# Patient Record
Sex: Female | Born: 1955 | Race: White | Hispanic: No | Marital: Married | Smoking: Never smoker
Health system: Southern US, Community
[De-identification: ages and names within clinical notes are randomized; demographics above are authoritative.]

## PROBLEM LIST (undated history)

## (undated) DIAGNOSIS — K5792 Diverticulitis of intestine, part unspecified, without perforation or abscess without bleeding: Secondary | ICD-10-CM

## (undated) DIAGNOSIS — I219 Acute myocardial infarction, unspecified: Secondary | ICD-10-CM

## (undated) HISTORY — PX: KNEE ARTHROPLASTY: SHX992

## (undated) HISTORY — PX: ABDOMINAL HYSTERECTOMY: SHX81

## (undated) HISTORY — PX: APPENDECTOMY: SHX54

## (undated) HISTORY — PX: CHOLECYSTECTOMY: SHX55

---

## 1998-05-29 ENCOUNTER — Encounter: Payer: Self-pay | Admitting: Emergency Medicine

## 1998-05-29 ENCOUNTER — Emergency Department (HOSPITAL_COMMUNITY): Admission: EM | Admit: 1998-05-29 | Discharge: 1998-05-29 | Payer: Self-pay | Admitting: Emergency Medicine

## 2001-08-01 ENCOUNTER — Encounter: Payer: Self-pay | Admitting: Emergency Medicine

## 2001-08-01 ENCOUNTER — Emergency Department (HOSPITAL_COMMUNITY): Admission: EM | Admit: 2001-08-01 | Discharge: 2001-08-01 | Payer: Self-pay | Admitting: Emergency Medicine

## 2005-11-22 ENCOUNTER — Emergency Department (HOSPITAL_COMMUNITY): Admission: EM | Admit: 2005-11-22 | Discharge: 2005-11-22 | Payer: Self-pay | Admitting: Emergency Medicine

## 2006-08-12 ENCOUNTER — Emergency Department (HOSPITAL_COMMUNITY): Admission: EM | Admit: 2006-08-12 | Discharge: 2006-08-12 | Payer: Self-pay | Admitting: Family Medicine

## 2006-08-22 ENCOUNTER — Emergency Department (HOSPITAL_COMMUNITY): Admission: EM | Admit: 2006-08-22 | Discharge: 2006-08-22 | Payer: Self-pay | Admitting: Family Medicine

## 2006-11-06 ENCOUNTER — Emergency Department (HOSPITAL_COMMUNITY): Admission: EM | Admit: 2006-11-06 | Discharge: 2006-11-06 | Payer: Self-pay | Admitting: Emergency Medicine

## 2006-12-01 ENCOUNTER — Ambulatory Visit (HOSPITAL_COMMUNITY): Admission: RE | Admit: 2006-12-01 | Discharge: 2006-12-01 | Payer: Self-pay | Admitting: Interventional Cardiology

## 2006-12-07 ENCOUNTER — Encounter (INDEPENDENT_AMBULATORY_CARE_PROVIDER_SITE_OTHER): Payer: Self-pay | Admitting: Interventional Cardiology

## 2006-12-07 ENCOUNTER — Ambulatory Visit (HOSPITAL_COMMUNITY): Admission: RE | Admit: 2006-12-07 | Discharge: 2006-12-07 | Payer: Self-pay | Admitting: Interventional Cardiology

## 2009-08-24 ENCOUNTER — Observation Stay (HOSPITAL_COMMUNITY): Admission: EM | Admit: 2009-08-24 | Discharge: 2009-08-25 | Payer: Self-pay | Admitting: Emergency Medicine

## 2010-10-25 LAB — BASIC METABOLIC PANEL
BUN: 12 mg/dL (ref 6–23)
BUN: 4 mg/dL — ABNORMAL LOW (ref 6–23)
CO2: 25 mEq/L (ref 19–32)
CO2: 27 mEq/L (ref 19–32)
Calcium: 9.1 mg/dL (ref 8.4–10.5)
Chloride: 104 mEq/L (ref 96–112)
Chloride: 102 mEq/L (ref 96–112)
Creatinine, Ser: 0.65 mg/dL (ref 0.4–1.2)
GFR calc Af Amer: >60 mL/min (ref >60)
Glucose, Bld: 104 mg/dL — ABNORMAL HIGH (ref 70–99)
Glucose, Bld: 99 mg/dL (ref 70–99)
Glucose, Bld: 99 mg/dL (ref 70–99)
Potassium: 4.7 mEq/L (ref 3.5–5.1)
Potassium: 3.3 mEq/L — ABNORMAL LOW (ref 3.5–5.1)
Sodium: 136 mEq/L (ref 135–145)
Sodium: 133 mEq/L — ABNORMAL LOW (ref 135–145)
Sodium: 135 mEq/L (ref 135–145)

## 2010-10-25 LAB — DIFFERENTIAL
Basophils Absolute: 0.1 10*3/uL (ref 0.0–0.1)
Eosinophils Relative: 0 % (ref 0–5)
Lymphocytes Relative: 31 % (ref 12–46)
Lymphs Abs: 2.4 10*3/uL (ref 0.7–4.0)
Neutrophils Relative %: 58 % (ref 43–77)

## 2010-10-25 LAB — CBC
MCHC: 34.7 g/dL (ref 30.0–36.0)
Platelets: 230 10*3/uL (ref 150–400)
Platelets: 187 10*3/uL (ref 150–400)
RBC: 4.55 MIL/uL (ref 3.87–5.11)
RDW: 14 % (ref 11.5–15.5)
RDW: 13.9 % (ref 11.5–15.5)
WBC: 7.8 10*3/uL (ref 4.0–10.5)

## 2010-10-25 LAB — LIPID PANEL: VLDL: 26 mg/dL (ref 0–40)

## 2010-10-25 LAB — CK TOTAL AND CKMB (NOT AT ARMC)
CK, MB: 4.2 ng/mL — ABNORMAL HIGH (ref 0.3–4.0)
Relative Index: 3.5 — ABNORMAL HIGH (ref 0.0–2.5)
Relative Index: INVALID (ref 0.0–2.5)

## 2010-10-25 LAB — POCT CARDIAC MARKERS: CKMB, poc: 1.3 ng/mL (ref 1.0–8.0)

## 2010-10-25 LAB — TROPONIN I: Troponin I: 0.01 ng/mL (ref 0.00–0.06)

## 2010-10-25 LAB — HEMOGLOBIN A1C
Hgb A1c MFr Bld: 5.7 % (ref 4.6–6.1)
Mean Plasma Glucose: 117 mg/dL

## 2010-10-25 LAB — BRAIN NATRIURETIC PEPTIDE: Pro B Natriuretic peptide (BNP): <30 pg/mL (ref 0.0–100.0)

## 2014-10-06 ENCOUNTER — Encounter (HOSPITAL_COMMUNITY): Payer: Self-pay | Admitting: *Deleted

## 2014-10-06 ENCOUNTER — Emergency Department (HOSPITAL_COMMUNITY): Payer: Self-pay

## 2014-10-06 ENCOUNTER — Observation Stay (HOSPITAL_COMMUNITY)
Admission: EM | Admit: 2014-10-06 | Discharge: 2014-10-08 | Disposition: A | Discharge status: Home / Self Care | Payer: Self-pay | Attending: Internal Medicine | Admitting: Internal Medicine

## 2014-10-06 ENCOUNTER — Emergency Department (HOSPITAL_COMMUNITY): Payer: MEDICAID

## 2014-10-06 ENCOUNTER — Other Ambulatory Visit (HOSPITAL_COMMUNITY): Payer: Self-pay

## 2014-10-06 DIAGNOSIS — K529 Noninfective gastroenteritis and colitis, unspecified: Secondary | ICD-10-CM

## 2014-10-06 DIAGNOSIS — Z23 Encounter for immunization: Secondary | ICD-10-CM | POA: Insufficient documentation

## 2014-10-06 DIAGNOSIS — K5792 Diverticulitis of intestine, part unspecified, without perforation or abscess without bleeding: Secondary | ICD-10-CM | POA: Diagnosis present

## 2014-10-06 DIAGNOSIS — Z79899 Other long term (current) drug therapy: Secondary | ICD-10-CM | POA: Insufficient documentation

## 2014-10-06 DIAGNOSIS — K5732 Diverticulitis of large intestine without perforation or abscess without bleeding: Secondary | ICD-10-CM

## 2014-10-06 DIAGNOSIS — R110 Nausea: Secondary | ICD-10-CM | POA: Insufficient documentation

## 2014-10-06 DIAGNOSIS — Z8719 Personal history of other diseases of the digestive system: Secondary | ICD-10-CM | POA: Insufficient documentation

## 2014-10-06 DIAGNOSIS — R079 Chest pain, unspecified: Secondary | ICD-10-CM | POA: Diagnosis present

## 2014-10-06 DIAGNOSIS — R112 Nausea with vomiting, unspecified: Secondary | ICD-10-CM

## 2014-10-06 DIAGNOSIS — R0789 Other chest pain: Principal | ICD-10-CM | POA: Insufficient documentation

## 2014-10-06 DIAGNOSIS — K2970 Gastritis, unspecified, without bleeding: Secondary | ICD-10-CM

## 2014-10-06 DIAGNOSIS — R61 Generalized hyperhidrosis: Secondary | ICD-10-CM | POA: Insufficient documentation

## 2014-10-06 DIAGNOSIS — E663 Overweight: Secondary | ICD-10-CM

## 2014-10-06 DIAGNOSIS — Z7982 Long term (current) use of aspirin: Secondary | ICD-10-CM | POA: Insufficient documentation

## 2014-10-06 DIAGNOSIS — Z880 Allergy status to penicillin: Secondary | ICD-10-CM | POA: Insufficient documentation

## 2014-10-06 DIAGNOSIS — I252 Old myocardial infarction: Secondary | ICD-10-CM | POA: Insufficient documentation

## 2014-10-06 HISTORY — DX: Diverticulitis of intestine, part unspecified, without perforation or abscess without bleeding: K57.92

## 2014-10-06 HISTORY — DX: Acute myocardial infarction, unspecified: I21.9

## 2014-10-06 LAB — BASIC METABOLIC PANEL
Anion gap: 7 (ref 5–15)
BUN: 10 mg/dL (ref 6–23)
CO2: 26 mmol/L (ref 19–32)
CREATININE: 0.74 mg/dL (ref 0.50–1.10)
Calcium: 9.3 mg/dL (ref 8.4–10.5)
Chloride: 104 mmol/L (ref 96–112)
GFR calc non Af Amer: >90 mL/min (ref >90)
GLUCOSE: 98 mg/dL (ref 70–99)
Potassium: 3.7 mmol/L (ref 3.5–5.1)
Sodium: 137 mmol/L (ref 135–145)

## 2014-10-06 LAB — D-DIMER, QUANTITATIVE (NOT AT ARMC): D-Dimer, Quant: 0.98 ug/mL-FEU — ABNORMAL HIGH (ref 0.00–0.48)

## 2014-10-06 LAB — CBC
HEMATOCRIT: 40.9 % (ref 36.0–46.0)
Hemoglobin: 14.4 g/dL (ref 12.0–15.0)
MCH: 30.7 pg (ref 26.0–34.0)
MCHC: 35.2 g/dL (ref 30.0–36.0)
MCV: 87.2 fL (ref 78.0–100.0)
PLATELETS: 205 10*3/uL (ref 150–400)
RBC: 4.69 MIL/uL (ref 3.87–5.11)
RDW: 12.9 % (ref 11.5–15.5)
WBC: 8.4 10*3/uL (ref 4.0–10.5)

## 2014-10-06 LAB — TSH: TSH: 1.195 u[IU]/mL (ref 0.350–4.500)

## 2014-10-06 LAB — TROPONIN I
Troponin I: <0.03 ng/mL (ref <0.031)
Troponin I: <0.03 ng/mL (ref <0.031)

## 2014-10-06 LAB — LIPASE, BLOOD: Lipase: 32 U/L (ref 11–59)

## 2014-10-06 LAB — I-STAT TROPONIN, ED: Troponin i, poc: 0 ng/mL (ref 0.00–0.08)

## 2014-10-06 MED ORDER — ACETAMINOPHEN 500 MG PO TABS
1000.0000 mg | ORAL_TABLET | Freq: Once | ORAL | Status: AC
  Administered 2014-10-06: 1000 mg via ORAL
  Filled 2014-10-06: qty 2

## 2014-10-06 MED ORDER — CARVEDILOL 3.125 MG PO TABS
3.1250 mg | ORAL_TABLET | Freq: Two times a day (BID) | ORAL | Status: DC
  Administered 2014-10-07 – 2014-10-08 (×3): 3.125 mg via ORAL
  Filled 2014-10-06 (×3): qty 1

## 2014-10-06 MED ORDER — SIMVASTATIN 20 MG PO TABS: 20.0000 mg | ORAL_TABLET | Freq: Every day | ORAL | Status: DC

## 2014-10-06 MED ORDER — SODIUM CHLORIDE 0.9 % IV SOLN
INTRAVENOUS | Status: DC
  Administered 2014-10-06 – 2014-10-07 (×2): 500 mL via INTRAVENOUS
  Administered 2014-10-07: 18:00:00 via INTRAVENOUS

## 2014-10-06 MED ORDER — MORPHINE SULFATE 2 MG/ML IJ SOLN
2.0000 mg | INTRAMUSCULAR | Status: DC | PRN
  Administered 2014-10-06 – 2014-10-07 (×2): 2 mg via INTRAVENOUS
  Filled 2014-10-06 (×2): qty 1

## 2014-10-06 MED ORDER — GI COCKTAIL ~~LOC~~
30.0000 mL | Freq: Four times a day (QID) | ORAL | Status: DC | PRN
  Administered 2014-10-07: 30 mL via ORAL
  Filled 2014-10-06: qty 30

## 2014-10-06 MED ORDER — MORPHINE SULFATE 4 MG/ML IJ SOLN
4.0000 mg | Freq: Once | INTRAMUSCULAR | Status: AC
  Administered 2014-10-06: 4 mg via INTRAVENOUS
  Filled 2014-10-06: qty 1

## 2014-10-06 MED ORDER — PANTOPRAZOLE SODIUM 40 MG PO TBEC
40.0000 mg | DELAYED_RELEASE_TABLET | Freq: Every day | ORAL | Status: DC
  Administered 2014-10-07: 40 mg via ORAL
  Filled 2014-10-06: qty 1

## 2014-10-06 MED ORDER — ONDANSETRON HCL 4 MG/2ML IJ SOLN
4.0000 mg | Freq: Once | INTRAMUSCULAR | Status: AC
  Administered 2014-10-06: 4 mg via INTRAVENOUS
  Filled 2014-10-06: qty 2

## 2014-10-06 MED ORDER — POLYVINYL ALCOHOL 1.4 % OP SOLN: 1.0000 [drp] | OPHTHALMIC | Status: DC | PRN

## 2014-10-06 MED ORDER — LORATADINE 10 MG PO TABS
10.0000 mg | ORAL_TABLET | Freq: Every day | ORAL | Status: DC
  Administered 2014-10-07 – 2014-10-08 (×2): 10 mg via ORAL
  Filled 2014-10-06 (×2): qty 1

## 2014-10-06 MED ORDER — ASPIRIN EC 81 MG PO TBEC
81.0000 mg | DELAYED_RELEASE_TABLET | Freq: Every day | ORAL | Status: DC
  Administered 2014-10-07 – 2014-10-08 (×2): 81 mg via ORAL
  Filled 2014-10-06 (×2): qty 1

## 2014-10-06 MED ORDER — CARBOXYMETHYLCELLULOSE SODIUM 0.5 % OP SOLN: 1.0000 [drp] | Freq: Two times a day (BID) | OPHTHALMIC | Status: DC | PRN

## 2014-10-06 MED ORDER — ACETAMINOPHEN 325 MG PO TABS
650.0000 mg | ORAL_TABLET | ORAL | Status: DC | PRN
  Administered 2014-10-08: 650 mg via ORAL
  Filled 2014-10-06: qty 2

## 2014-10-06 MED ORDER — HEPARIN SODIUM (PORCINE) 5000 UNIT/ML IJ SOLN
5000.0000 [IU] | Freq: Three times a day (TID) | INTRAMUSCULAR | Status: DC
  Administered 2014-10-06 – 2014-10-08 (×6): 5000 [IU] via SUBCUTANEOUS
  Filled 2014-10-06 (×6): qty 1

## 2014-10-06 MED ORDER — ONDANSETRON HCL 4 MG/2ML IJ SOLN
4.0000 mg | Freq: Four times a day (QID) | INTRAMUSCULAR | Status: DC | PRN
  Administered 2014-10-07: 4 mg via INTRAVENOUS
  Filled 2014-10-06: qty 2

## 2014-10-06 NOTE — ED Provider Notes (Signed)
CSN: [id number]     Arrival date & time 10/06/14  1441 History   First MD Initiated Contact with Patient 10/06/14 1501     Chief Complaint  Patient presents with  . Chest Pain     (Consider location/radiation/quality/duration/timing/severity/associated sxs/prior Treatment) Patient is a 59 y.o. female presenting with chest pain.  Chest Pain Pain location:  Substernal area Pain quality: sharp   Pain radiates to:  Mid back Pain severity:  Severe Onset quality:  Sudden Duration:  20 minutes Timing:  Constant Progression:  Resolved Chronicity:  Recurrent (states it felt similar to when she had a heart attack several years ago.) Context comment:  Working around the house, preparing lunch Relieved by:  Rest and nitroglycerin Worsened by:  Nothing tried Associated symptoms: diaphoresis and nausea   Associated symptoms: no dizziness, no shortness of breath and not vomiting     Past Medical History  Diagnosis Date  . MI (myocardial infarction)   . Diverticulitis    Past Surgical History  Procedure Laterality Date  . Knee arthroplasty Bilateral   . Cholecystectomy     No family history on file. History  Substance Use Topics  . Smoking status: Never Smoker   . Smokeless tobacco: Not on file  . Alcohol Use: No   OB History    No data available     Review of Systems  Constitutional: Positive for diaphoresis.  Respiratory: Negative for shortness of breath.   Cardiovascular: Positive for chest pain.  Gastrointestinal: Positive for nausea. Negative for vomiting.  Neurological: Negative for dizziness.  All other systems reviewed and are negative.     Allergies  Penicillins  Home Medications   Prior to Admission medications   Not on File   BP 101/50 mmHg  Pulse 84  Temp(Src) 98.1 F (36.7 C) (Oral)  Resp 20  SpO2 98% Physical Exam  Constitutional: She is oriented to person, place, and time. She appears well-developed and well-nourished. No distress.  HENT:   Head: Normocephalic and atraumatic.  Mouth/Throat: Oropharynx is clear and moist.  Eyes: Conjunctivae are normal. Pupils are equal, round, and reactive to light. No scleral icterus.  Neck: Neck supple.  Cardiovascular: Normal rate, regular rhythm, normal heart sounds and intact distal pulses.   No murmur heard. Pulmonary/Chest: Effort normal and breath sounds normal. No stridor. No respiratory distress. She has no rales.  Abdominal: Soft. Bowel sounds are normal. She exhibits no distension. There is no tenderness.  Musculoskeletal: Normal range of motion. She exhibits no edema.  Neurological: She is alert and oriented to person, place, and time.  Skin: Skin is warm and dry. No rash noted.  Psychiatric: She has a normal mood and affect. Her behavior is normal.  Nursing note and vitals reviewed.   ED Course  Procedures (including critical care time) Labs Review Labs Reviewed  CBC  BASIC METABOLIC PANEL  Rosezena Sensor, ED    Imaging Review Dg Chest 2 View  10/06/2014   CLINICAL DATA:  Chest pain, midsternal sharp pain beginning today.  EXAM: CHEST  2 VIEW  COMPARISON:  10/24/2013  FINDINGS: The heart size and mediastinal contours are within normal limits. Both lungs are clear. The visualized skeletal structures are unremarkable.  IMPRESSION: No active cardiopulmonary disease.   Electronically Signed   By: Charlett Nose M.D.   On: 10/06/2014 15:48  All radiology studies independently viewed by me.      EKG Interpretation   Date/Time:  Sunday October 06 2014 14:48:24 EST Ventricular Rate:  75 PR Interval:  152 QRS Duration: 109 QT Interval:  396 QTC Calculation: 442 R Axis:   78 Text Interpretation:  Sinus rhythm Consider right atrial enlargement No  significant change was found Confirmed by [facility]  MD, TREY ([id number]) on  10/06/2014 3:42:47 PM      MDM   Final diagnoses:  Chest pain, unspecified chest pain type    59 yo female with hx of CAD, does not currently follow  up with cardiologist or PCP, presenting with chest pain.  Her pain has some typical and some atypical features.  Review of chart shows NM stress which showed a small fixed defect.  Plan for admission, risk stratification, cycle enzmes.    Candyce ChurnJohn David Patina Spanier III, MD 10/06/14 [phone]

## 2014-10-06 NOTE — ED Notes (Signed)
Admitting MD made aware pt reports CP 3/10.

## 2014-10-06 NOTE — H&P (Addendum)
[facility] History and Physical  Theresa Montgomery ZOX:[id number] DOB: July 12, 1956 DOA: 10/06/2014  Referring physician: Dr. Loretha Stapler PCP: No PCP Per Patient   Chief Complaint: chest pain  HPI: Theresa Montgomery is a 59 y.o. female with PMH significant for obesity, prior hx of small MI, HLD, diverticulitis and hay fever; presented to ED complaining of CP that started in the morning of admission lasting approx 15-20 minutes when present and gradually worsening in intensity. Patient reports pain in the middle of her chest and radiated to left side and back; sharp and 10/10 in intensity. Patient reported associated diaphoresis and nausea when pain was present.  Pain improved with NTG and morphine given in ED. She denies palpitations, cough, fever, chills, vomiting, blurred vision, melena, hematemesis, hematochezia or any other complaints. She did mentioned some sour taste in her mouth and some minor discomfort whe she first woke up; but that went away. Of note, she endorses pain was similar like the one she had in 2011 when was told she suffered small MI. In ED troponin neg, neg CXR, no acute ischemic changes in EKG and normal vital signs. [facility] called to admit patient for further evaluation and treatment.  Review of Systems:  Negative, except as otherwise mentioned on HPI.  Past Medical History  Diagnosis Date  . MI (myocardial infarction)   . Diverticulitis    Past Surgical History  Procedure Laterality Date  . Knee arthroplasty Bilateral   . Cholecystectomy     Social History:  reports that she has never smoked. She does not have any smokeless tobacco history on file. She reports that she does not drink alcohol or use illicit drugs.  Allergies  Allergen Reactions  . Adhesive [Tape] Rash    Paper tape is ok  . Penicillins Hives and Rash    Family Hx: positive for HTN only; otherwise no contributory according to patient.  Prior to Admission medications   Medication Sig Start Date End  Date Taking? Authorizing Provider  aspirin EC 81 MG tablet Take 81 mg by mouth daily.   Yes Historical Provider, MD  carboxymethylcellulose (REFRESH PLUS) 0.5 % SOLN Place 1 drop into both eyes 2 (two) times daily as needed (irritation).   Yes Historical Provider, MD  Chlorpheniramine Maleate (ALLERGY PO) Take 1 tablet by mouth daily.   Yes Historical Provider, MD  nitroGLYCERIN (NITROSTAT) 0.4 MG SL tablet Place 0.4 mg under the tongue every 5 (five) minutes as needed for chest pain.   Yes Historical Provider, MD  Omega-3 Fatty Acids (FISH OIL) 1000 MG CAPS Take 2,000 mg by mouth daily.   Yes Historical Provider, MD   Physical Exam: Filed Vitals:   10/06/14 1744 10/06/14 1800 10/06/14 1815 10/06/14 1836  BP: 120/74 116/76 126/80 137/88  Pulse:  79 74 71  Temp: 98.1 F (36.7 C)   98.7 F (37.1 C)  TempSrc: Oral   Oral  Resp: Height:     (1.676 m)  Weight:    100.971 kg (222 lb 9.6 oz)  SpO2: 96% 97% 96% 99%    Wt Readings from Last 3 Encounters:  10/06/14 100.971 kg (222 lb 9.6 oz)    General:  Appears comfortable; reports pain 2-3/10 on exam; no palpitations, no fever, no cough. Pleasant, conversant and cooperative with examination. Eyes: PERRL, normal lids, irises & conjunctiva, no icterus ENT: grossly normal hearing, lips & tongue; no exudates, no erythema; currently w/o drainage out of ears or nostrils Neck:  no LAD, masses or thyromegaly; no JVD Cardiovascular: RRR, no m/r/g. Trace LE edema. Telemetry: SR, no arrhythmias appreciated in ED tele  Respiratory: CTA bilaterally, no w/r/r. Normal respiratory effort. Abdomen: obese, mild discomfort with deep palpation in mid abdomen, soft, ND; no guarding and with positive BS Skin: no rash or induration seen on exam Musculoskeletal: grossly normal tone BUE/BLE; limited range of motion in her knees; no new.  Psychiatric: grossly normal mood and affect, speech fluent and appropriate Neurologic: grossly non-focal.            Labs on Admission:  Basic Metabolic Panel:  Recent Labs Lab 10/06/14 1504  NA 137  K 3.7  CL 104  CO2 26  GLUCOSE 98  BUN 10  CREATININE 0.74  CALCIUM 9.3   CBC:  Recent Labs Lab 10/06/14 1504  WBC 8.4  HGB 14.4  HCT 40.9  MCV 87.2  PLT 205   Radiological Exams on Admission: Dg Chest 2 View  10/06/2014   CLINICAL DATA:  Chest pain, midsternal sharp pain beginning today.  EXAM: CHEST  2 VIEW  COMPARISON:  10/24/2013  FINDINGS: The heart size and mediastinal contours are within normal limits. Both lungs are clear. The visualized skeletal structures are unremarkable.  IMPRESSION: No active cardiopulmonary disease.   Electronically Signed   By: Charlett NoseKevin  Dover M.D.   On: 10/06/2014 15:48    EKG: No acute ischemic changes, sinus rhythm, normal axis  Assessment/Plan 1-Chest pain: unspecified. But patient with heart score of 3 (obesity, hx of small MI and moderately suspicious story) and prior small infarct seen on Myoview 4.5 years ago. Has never follow up with cardiology or PCP since then. Story moderately suspicious and with improvement in pain after NTG. -will admit to telemetry -cycle cardiac enzymes and EKG -will check 2-D echo -continue ASA, low dose coreg and statins -EKG w/o acute ischemic changes (will not start heparin drip or call cardiology at this moment) -will start PPI and PRN GI cocktail -also PRN NTG and oxygen -will check lipase, d-dimer, TSH, lipid panel and A1C -CXR w/o acute cardiopulmonary process -low wells score, no tachycardia, no tachypnea, no fever  2-Overweight: low calorie diet and exercise has been discussed with patient  3-hay fever: continue claritin and PRN refreshing eye drops  4-hx of Diverticulitis: no active currently. Will monitor  5-HLD: will check lipid panel. Started on zocor   Please follow cardiac enzymes and 2-D echo; will be good to discuss with cardiology to determine inpatient vs outpatient stratification.  Code  Status: Full DVT Prophylaxis: heparin  Family Communication: no family at bedside Disposition Plan: observation, LOS < 2 midnights; telemetry bed  Time spent: 55 minutes  Vassie LollMadera, Kelechi Orgeron [facility] Pager [phone]

## 2014-10-06 NOTE — ED Notes (Signed)
Per EMS-pt presents from home c/o CP starting at 1PM today.  Pt took 2 SL NTG at home and takes a 81 mg ASA daily. 81mg  ASA x 3  and 1 SL NTG given in route.  Pt has hx of MI in 2011.  BP-110/58 P-83 R-16 O2-96% RA.  CBG 94.  Pt a x 4, NAD.

## 2014-10-07 ENCOUNTER — Observation Stay (HOSPITAL_COMMUNITY): Payer: Self-pay

## 2014-10-07 ENCOUNTER — Encounter (HOSPITAL_COMMUNITY): Payer: Self-pay

## 2014-10-07 DIAGNOSIS — R0789 Other chest pain: Principal | ICD-10-CM

## 2014-10-07 DIAGNOSIS — R072 Precordial pain: Secondary | ICD-10-CM

## 2014-10-07 DIAGNOSIS — R079 Chest pain, unspecified: Secondary | ICD-10-CM

## 2014-10-07 DIAGNOSIS — R112 Nausea with vomiting, unspecified: Secondary | ICD-10-CM

## 2014-10-07 DIAGNOSIS — E669 Obesity, unspecified: Secondary | ICD-10-CM

## 2014-10-07 LAB — BASIC METABOLIC PANEL
ANION GAP: 3 — AB (ref 5–15)
BUN: 9 mg/dL (ref 6–23)
CO2: 27 mmol/L (ref 19–32)
CREATININE: 0.68 mg/dL (ref 0.50–1.10)
Calcium: 8.7 mg/dL (ref 8.4–10.5)
Chloride: 106 mmol/L (ref 96–112)
GFR calc non Af Amer: >90 mL/min (ref >90)
Glucose, Bld: 104 mg/dL — ABNORMAL HIGH (ref 70–99)
Potassium: 3.6 mmol/L (ref 3.5–5.1)
SODIUM: 136 mmol/L (ref 135–145)

## 2014-10-07 LAB — LIPID PANEL
CHOL/HDL RATIO: 6.4 ratio
Cholesterol: 218 mg/dL — ABNORMAL HIGH (ref 0–200)
HDL: 34 mg/dL — ABNORMAL LOW (ref >39)
LDL Cholesterol: 143 mg/dL — ABNORMAL HIGH (ref 0–99)
Triglycerides: 205 mg/dL — ABNORMAL HIGH (ref <150)
VLDL: 41 mg/dL — AB (ref 0–40)

## 2014-10-07 LAB — HEPATIC FUNCTION PANEL
ALT: 36 U/L — AB (ref 0–35)
AST: 27 U/L (ref 0–37)
Albumin: 3.8 g/dL (ref 3.5–5.2)
Alkaline Phosphatase: 53 U/L (ref 39–117)
BILIRUBIN DIRECT: 0.2 mg/dL (ref 0.0–0.5)
Indirect Bilirubin: 0.6 mg/dL (ref 0.3–0.9)
Total Bilirubin: 0.8 mg/dL (ref 0.3–1.2)
Total Protein: 6.3 g/dL (ref 6.0–8.3)

## 2014-10-07 LAB — TROPONIN I

## 2014-10-07 MED ORDER — PANTOPRAZOLE SODIUM 40 MG PO TBEC
40.0000 mg | DELAYED_RELEASE_TABLET | Freq: Two times a day (BID) | ORAL | Status: DC
  Filled 2014-10-07: qty 1

## 2014-10-07 MED ORDER — IOHEXOL 350 MG/ML SOLN
80.0000 mL | Freq: Once | INTRAVENOUS | Status: AC | PRN
  Administered 2014-10-07: 80 mL via INTRAVENOUS

## 2014-10-07 MED ORDER — HYDROMORPHONE HCL 1 MG/ML IJ SOLN
0.5000 mg | Freq: Once | INTRAMUSCULAR | Status: AC
Start: 2014-10-07 — End: 2014-10-07
  Administered 2014-10-07: 0.5 mg via INTRAVENOUS
  Filled 2014-10-07: qty 1

## 2014-10-07 MED ORDER — INFLUENZA VAC SPLIT QUAD 0.5 ML IM SUSY
0.5000 mL | PREFILLED_SYRINGE | INTRAMUSCULAR | Status: AC
  Administered 2014-10-08: 0.5 mL via INTRAMUSCULAR
  Filled 2014-10-07: qty 0.5

## 2014-10-07 MED ORDER — ATORVASTATIN CALCIUM 20 MG PO TABS
20.0000 mg | ORAL_TABLET | Freq: Every day | ORAL | Status: DC
  Administered 2014-10-07: 20 mg via ORAL
  Filled 2014-10-07: qty 1

## 2014-10-07 MED ORDER — OXYCODONE-ACETAMINOPHEN 5-325 MG PO TABS
1.0000 | ORAL_TABLET | Freq: Four times a day (QID) | ORAL | Status: DC | PRN
  Administered 2014-10-08: 1 via ORAL

## 2014-10-07 NOTE — Consult Note (Signed)
CARDIOLOGY CONSULT NOTE  Patient ID: Theresa Montgomery MRN: [id number] DOB/AGE: 02-16-1956 59 y.o.  Admit date: 10/06/2014 Reason for Consultation: Chest pain  HPI: 59 yo with history of obesity and hyperlipidemia was admitted yesterday with chest pain.  She says that she has been under a lot of stress at home.  She has been having occasional episodes of lower chest pain radiating to her back with no particular trigger.  Yesterday morning, she developed lower central chest pain radiating to her back.  This lasted for about 2.5 hours so she went to the ER. She did report an acid taste in her mouth. Symptoms resolved with morphine.  She had more chest pain last night, 4/10, that again resolved with morphine.  This morning, she has nausea that she attributes to the pain meds.  Chest pain was not pleuritic, positional, or exertional.  At baseline, she has dyspnea with moderate exertion.  She had an admission for atypical chest pain in 2011 with Cardiolite showing apical fixed defect, no ischemia.   So far, she has had 3 sets of cardiac enzymes negative and a normal ECG.  She had an elevated D dimer and is going for CTA chest to rule out PE.   Review of systems complete and found to be negative unless listed above in HPI  Past Medical History: 1. Obesity 2. Hyperlipidemia 3. H/o diverticulitis 4. GERD 5. Cholecystectomy 6. Atypical chest pain: Cardiolite 2011 with small, fixed apical defect, no ischemia, EF 70%.   History reviewed. No pertinent family history.  History   Social History  . Marital Status: Married    Spouse Name: N/A  . Number of Children: N/A  . Years of Education: N/A   Occupational History  . Not on file.   Social History Main Topics  . Smoking status: Never Smoker   . Smokeless tobacco: Not on file  . Alcohol Use: No  . Drug Use: No  . Sexual Activity: Not on file   Other Topics Concern  . Not on file   Social History Narrative     Prescriptions prior to  admission  Medication Sig Dispense Refill Last Dose  . aspirin EC 81 MG tablet Take 81 mg by mouth daily.   10/06/2014 at Unknown time  . carboxymethylcellulose (REFRESH PLUS) 0.5 % SOLN Place 1 drop into both eyes 2 (two) times daily as needed (irritation).   10/06/2014 at Unknown time  . Chlorpheniramine Maleate (ALLERGY PO) Take 1 tablet by mouth daily.   10/06/2014 at Unknown time  . nitroGLYCERIN (NITROSTAT) 0.4 MG SL tablet Place 0.4 mg under the tongue every 5 (five) minutes as needed for chest pain.   10/06/2014 at Unknown time  . Omega-3 Fatty Acids (FISH OIL) 1000 MG CAPS Take 2,000 mg by mouth daily.   10/06/2014 at Unknown time   Current Scheduled Meds: . aspirin EC  81 mg Oral Daily  . carvedilol  3.125 mg Oral BID WC  . heparin  5,000 Units Subcutaneous 3 times per day  . loratadine  10 mg Oral Daily  . pantoprazole  40 mg Oral Q1200  . simvastatin  20 mg Oral q1800   Continuous Infusions: . sodium chloride 500 mL (10/07/14 0657)   PRN Meds:.acetaminophen, gi cocktail, morphine injection, ondansetron (ZOFRAN) IV, polyvinyl alcohol   Physical exam Blood pressure 109/77, pulse 75, temperature 97.9 F (36.6 C), temperature source Oral, resp. rate 18, height  (1.676 m), weight 224 lb 11.2 oz (101.923 kg), SpO2  96 %. General: NAD Neck: No JVD, no thyromegaly or thyroid nodule.  Lungs: Clear to auscultation bilaterally with normal respiratory effort. CV: Nondisplaced PMI.  Heart regular S1/S2, no S3/S4, no murmur.  No peripheral edema.  No carotid bruit.  Normal pedal pulses.  Abdomen: Soft, nontender, no hepatosplenomegaly, no distention.  Skin: Intact without lesions or rashes.  Neurologic: Alert and oriented x 3.  Psych: Normal affect. Extremities: No clubbing or cyanosis.  HEENT: Normal.   Labs:   Lab Results  Component Value Date   WBC 8.4 10/06/2014   HGB 14.4 10/06/2014   HCT 40.9 10/06/2014   MCV 87.2 10/06/2014   PLT 205 10/06/2014    Recent Labs Lab  10/07/14 0109  NA 136  K 3.6  CL 106  CO2 27  BUN 9  CREATININE 0.68  CALCIUM 8.7  GLUCOSE 104*   Lab Results  Component Value Date   CKTOTAL 120 08/24/2009   CKMB 4.2* 08/24/2009   TROPONINI <0.03 10/07/2014    Lab Results  Component Value Date   CHOL 218* 10/07/2014   CHOL  08/24/2009    198        ATP III CLASSIFICATION:  <200     mg/dL   Desirable  [id number]  mg/dL   Borderline High  >=469>=240    mg/dL   High          Lab Results  Component Value Date   HDL 34* 10/07/2014   HDL 37* 08/24/2009    Radiology: - CXR: NSR, normal  EKG: NSR, normal  ASSESSMENT AND PLAN: 59 yo with history of obesity and hyperlipidemia was admitted yesterday with chest pain. Pain was atypical, prolonged, and associated with negative cardiac enzymes and a normal ECG.  She has a history of GERD, cannot rule this out as an etiology. She had elevated D dimer.  - CTA chest today to rule out PE.  - Would treat with PPI for reflux.  - Given prolonged pain yesterday with negative enzymes and normal ECG, suspect probably non-cardiac pain.  She should get echo today.  If this looks ok, she can be discharged and we will arrange outpatient Cardiolite this week. - She will need ASA 81, and with elevated LDL would start atorvastatin 20 mg daily for home.   Signed: Marca AnconaDalton Jazmon Kos 10/07/2014 8:17 AM

## 2014-10-07 NOTE — Progress Notes (Signed)
Echocardiogram 2D Echocardiogram has been performed.  Theresa Montgomery, Theresa Montgomery 10/07/2014, 1:42 PM

## 2014-10-07 NOTE — Progress Notes (Signed)
[facility] PROGRESS NOTE  MINNA DUMIRE ZOX:[id number] DOB: Oct 14, 1955 DOA: 10/06/2014 PCP: No PCP Per Patient  Brief Summary  Theresa Montgomery is a 59 y.o. female with PMH significant for obesity, prior hx of small MI, HLD, diverticulitis and hay fever; presented to ED complaining of CP that started in the morning of admission lasting approx 15-20 minutes when present and gradually worsening in intensity. Patient reports pain in the middle of her chest and radiated to left side and back; sharp and 10/10 in intensity. Patient reported associated diaphoresis and nausea when pain was present. Pain improved with NTG and morphine given in ED. She denied palpitations, cough, fever, chills, vomiting, blurred vision, melena, hematemesis, hematochezia or any other complaints. She did mention some sour taste in her mouth and some minor discomfort whe she first woke up; but that went away.  Of note, she endorsed pain was like the one she had in 2011 when she suffered small MI. In ED troponin neg, neg CXR, no acute ischemic changes in EKG and normal vital signs.    Assessment/Plan  Chest pain concerning for unstable angina vs. gastritis -  D-dimer elevated -  CTA chest negative for PE or dissection  -  ECHO pending -  NM stress test in AM per patient request -  ASA, statin, BB with prn NTG -  Tele:  NSR -  Troponins:  neg  Nausea and vomiting -  Lipase wnl -  LFT -  PPI with GI cocktail prn  GERD, off PPI for last 1.5 months - resume PPI  Obese, BMI 36 -  low calorie diet and exercise discussed with patient  Hay fever: continue claritin and PRN refreshing eye drops  HLD, started atorvastatin  Lab Results  Component Value Date   CHOL 218* 10/07/2014   HDL 34* 10/07/2014   LDLCALC 143* 10/07/2014   TRIG 205* 10/07/2014   CHOLHDL 6.4 10/07/2014   RIGHT calf pain with elevated D-dimer -  Duplex lower extremity  Diet:  Healthy heart Access:  PIV IVF:  yes Proph:  heparin  Code  Status: full Family Communication: patient alone Disposition Plan: pending improvement in nausea, vomiting and stress test in AM   Consultants:  Cardiology  Procedures:  CT angio chest  ECHO  Antibiotics:  None    HPI/Subjective:  Still having headache, nausea, vomiting.   Objective: Filed Vitals:   10/07/14 0400 10/07/14 0742 10/07/14 0859 10/07/14 1140  BP: 99/60 109/77 114/72 104/65  Pulse: 71 75 83 72  Temp: 98.5 F (36.9 C) 97.9 F (36.6 C)  97.5 F (36.4 C)  TempSrc: Oral Oral  Oral  Resp: Height:      Weight: 101.923 kg (224 lb 11.2 oz)     SpO2: 96% 96%  97%    Intake/Output Summary (Last 24 hours) at 10/07/14 1257 Last data filed at 10/07/14 0800  Gross per 24 hour  Intake  337.5 ml  Output    400 ml  Net  -62.5 ml   Filed Weights   10/06/14 1836 10/07/14 0400  Weight: 100.971 kg (222 lb 9.6 oz) 101.923 kg (224 lb 11.2 oz)    Exam:   General:  Obese F, No acute distress  HEENT:  NCAT, MMM  Cardiovascular:  RRR, nl S1, S2 no mrg, 2+ pulses, warm extremities  Respiratory:  CTAB, no increased WOB  Abdomen:   NABS, soft, NT/ND  MSK:   Normal tone and bulk, no LEE,  pain with palpation of posterior right calf  Neuro:  Grossly intact  Data Reviewed: Basic Metabolic Panel:  Recent Labs Lab 10/06/14 1504 10/07/14 0109  NA 137 136  K 3.7 3.6  CL 104 106  CO2 26 27  GLUCOSE 98 104*  BUN 10 9  CREATININE 0.74 0.68  CALCIUM 9.3 8.7   Liver Function Tests: No results for input(s): AST, ALT, ALKPHOS, BILITOT, PROT, ALBUMIN in the last 168 hours.  Recent Labs Lab 10/06/14 1926  LIPASE 32   No results for input(s): AMMONIA in the last 168 hours. CBC:  Recent Labs Lab 10/06/14 1504  WBC 8.4  HGB 14.4  HCT 40.9  MCV 87.2  PLT 205   Cardiac Enzymes:  Recent Labs Lab 10/06/14 1926 10/06/14 2205 10/07/14 0109  TROPONINI <0.03 <0.03 <0.03   BNP (last 3 results) No results for input(s): BNP in the last 8760  hours.  ProBNP (last 3 results) No results for input(s): PROBNP in the last 8760 hours.  CBG: No results for input(s): GLUCAP in the last 168 hours.  No results found for this or any previous visit (from the past 240 hour(s)).   Studies: Dg Chest 2 View  10/06/2014   CLINICAL DATA:  Chest pain, midsternal sharp pain beginning today.  EXAM: CHEST  2 VIEW  COMPARISON:  10/24/2013  FINDINGS: The heart size and mediastinal contours are within normal limits. Both lungs are clear. The visualized skeletal structures are unremarkable.  IMPRESSION: No active cardiopulmonary disease.   Electronically Signed   By: Charlett NoseKevin  Dover M.D.   On: 10/06/2014 15:48   Ct Angio Chest Pe W/cm &/or Wo Cm  10/07/2014   CLINICAL DATA:  Acute chest pain.  EXAM: CT ANGIOGRAPHY CHEST WITH CONTRAST  TECHNIQUE: Multidetector CT imaging of the chest was performed using the standard protocol during bolus administration of intravenous contrast. Multiplanar CT image reconstructions and MIPs were obtained to evaluate the vascular anatomy.  CONTRAST:  80mL OMNIPAQUE IOHEXOL 350 MG/ML SOLN  COMPARISON:  Chest radiograph of October 06, 2014.  FINDINGS: No pneumothorax or pleural effusion is noted. Minimal subsegmental atelectasis is noted posteriorly in the right lung base. There is no evidence of pulmonary embolus. There is no evidence of thoracic aortic dissection or aneurysm. Visualized portion of upper abdomen is unremarkable. No significant mediastinal mass or adenopathy is noted. No significant osseous abnormality is noted in the chest.  Review of the MIP images confirms the above findings.  IMPRESSION: No evidence of pulmonary embolus. No significant acute abnormality seen in the chest.   Electronically Signed   By: Lupita RaiderJames  Green Jr, M.D.   On: 10/07/2014 08:52    Scheduled Meds: . aspirin EC  81 mg Oral Daily  . carvedilol  3.125 mg Oral BID WC  . heparin  5,000 Units Subcutaneous 3 times per day  . loratadine  10 mg Oral  Daily  . [START ON 10/08/2014] pantoprazole  40 mg Oral BID AC  . simvastatin  20 mg Oral q1800   Continuous Infusions: . sodium chloride 500 mL (10/07/14 0657)    Principal Problem:   Chest pain Active Problems:   Overweight   Diverticulitis   Pain in the chest    Time spent: 30 min    Adaline Trejos, [facility] LufkinMACKENZIE  [facility] Pager [phone]. If 7PM-7AM, please contact night-coverage at [email or web address], password [email or web address] 10/07/2014, 12:57 PM

## 2014-10-07 NOTE — Progress Notes (Signed)
UR completed 

## 2014-10-08 ENCOUNTER — Observation Stay (HOSPITAL_COMMUNITY): Payer: Self-pay

## 2014-10-08 ENCOUNTER — Encounter (HOSPITAL_COMMUNITY): Payer: Self-pay

## 2014-10-08 DIAGNOSIS — K529 Noninfective gastroenteritis and colitis, unspecified: Secondary | ICD-10-CM

## 2014-10-08 DIAGNOSIS — K2970 Gastritis, unspecified, without bleeding: Secondary | ICD-10-CM

## 2014-10-08 DIAGNOSIS — R079 Chest pain, unspecified: Secondary | ICD-10-CM

## 2014-10-08 LAB — COMPREHENSIVE METABOLIC PANEL
ALT: 32 U/L (ref 0–35)
AST: 22 U/L (ref 0–37)
Albumin: 3.6 g/dL (ref 3.5–5.2)
Alkaline Phosphatase: 56 U/L (ref 39–117)
Anion gap: 8 (ref 5–15)
BUN: 8 mg/dL (ref 6–23)
CHLORIDE: 105 mmol/L (ref 96–112)
CO2: 28 mmol/L (ref 19–32)
CREATININE: 0.73 mg/dL (ref 0.50–1.10)
Calcium: 9.1 mg/dL (ref 8.4–10.5)
GFR calc Af Amer: >90 mL/min (ref >90)
Glucose, Bld: 106 mg/dL — ABNORMAL HIGH (ref 70–99)
Potassium: 4.2 mmol/L (ref 3.5–5.1)
Sodium: 141 mmol/L (ref 135–145)
Total Bilirubin: 0.6 mg/dL (ref 0.3–1.2)
Total Protein: 6 g/dL (ref 6.0–8.3)

## 2014-10-08 LAB — CBC
HEMATOCRIT: 40.4 % (ref 36.0–46.0)
Hemoglobin: 13.6 g/dL (ref 12.0–15.0)
MCH: 30.3 pg (ref 26.0–34.0)
MCHC: 33.7 g/dL (ref 30.0–36.0)
MCV: 90 fL (ref 78.0–100.0)
PLATELETS: 199 10*3/uL (ref 150–400)
RBC: 4.49 MIL/uL (ref 3.87–5.11)
RDW: 13.3 % (ref 11.5–15.5)
WBC: 7.3 10*3/uL (ref 4.0–10.5)

## 2014-10-08 LAB — HEMOGLOBIN A1C
Hgb A1c MFr Bld: 5.4 % (ref 4.8–5.6)
Mean Plasma Glucose: 108 mg/dL

## 2014-10-08 MED ORDER — PANTOPRAZOLE SODIUM 40 MG PO TBEC: 40.0000 mg | DELAYED_RELEASE_TABLET | Freq: Two times a day (BID) | ORAL | Status: DC

## 2014-10-08 MED ORDER — TECHNETIUM TC 99M SESTAMIBI GENERIC - CARDIOLITE
10.0000 | Freq: Once | INTRAVENOUS | Status: AC | PRN
  Administered 2014-10-08: 10 via INTRAVENOUS

## 2014-10-08 MED ORDER — ATORVASTATIN CALCIUM 20 MG PO TABS: 20.0000 mg | ORAL_TABLET | Freq: Every day | ORAL | Status: DC

## 2014-10-08 MED ORDER — REGADENOSON 0.4 MG/5ML IV SOLN
0.4000 mg | Freq: Once | INTRAVENOUS | Status: AC
  Administered 2014-10-08: 0.4 mg via INTRAVENOUS
  Filled 2014-10-08: qty 5

## 2014-10-08 MED ORDER — CARVEDILOL 3.125 MG PO TABS: 3.1250 mg | ORAL_TABLET | Freq: Two times a day (BID) | ORAL | Status: DC

## 2014-10-08 MED ORDER — ONDANSETRON HCL 4 MG PO TABS: 4.0000 mg | ORAL_TABLET | Freq: Three times a day (TID) | ORAL | Status: DC | PRN

## 2014-10-08 MED ORDER — REGADENOSON 0.4 MG/5ML IV SOLN
INTRAVENOUS | Status: AC
  Filled 2014-10-08: qty 5

## 2014-10-08 MED ORDER — TECHNETIUM TC 99M SESTAMIBI GENERIC - CARDIOLITE
30.0000 | Freq: Once | INTRAVENOUS | Status: AC | PRN
  Administered 2014-10-08: 30 via INTRAVENOUS

## 2014-10-08 MED ORDER — OXYCODONE-ACETAMINOPHEN 5-325 MG PO TABS
ORAL_TABLET | ORAL | Status: AC
  Administered 2014-10-08: 1 via ORAL
  Filled 2014-10-08: qty 1

## 2014-10-08 NOTE — Progress Notes (Signed)
UR completed 

## 2014-10-08 NOTE — Progress Notes (Signed)
SUBJECTIVE:  No further CP  OBJECTIVE:   Vitals:   Filed Vitals:   10/07/14 2348 10/08/14 0400 10/08/14 0800 10/08/14 1031  BP: 112/62 107/64 106/70 120/72  Pulse: 76 74 67   Temp: 98.5 F (36.9 C) 98.9 F (37.2 C) 98.1 F (36.7 C)   TempSrc: Oral Oral Oral   Resp: 18 18 18    Height:      Weight:  225 lb 6.4 oz (102.241 kg)    SpO2: 96% 95% 96%    I&O's:   Intake/Output Summary (Last 24 hours) at 10/08/14 1042 Last data filed at 10/08/14 0800  Gross per 24 hour  Intake 1211.67 ml  Output    200 ml  Net 1011.67 ml   TELEMETRY: Reviewed telemetry pt in NSR:     PHYSICAL EXAM General: Well developed, well nourished, in no acute distress Head: Eyes PERRLA, No xanthomas.   Normal cephalic and atramatic  Lungs:   Clear bilaterally to auscultation and percussion. Heart:  RRR with no M/R/G Abdomen: Bowel sounds are positive, abdomen soft and non-tender without masses  Extremities:   No clubbing, cyanosis or edema.  DP +1 Neuro: Alert and oriented X 3. Psych:  Good affect, responds appropriately   LABS: Basic Metabolic Panel:  Recent Labs  16/05/9601/29/16 0109 10/08/14 0352  NA 136 141  K 3.6 4.2  CL 106 105  CO2 27 28  GLUCOSE 104* 106*  BUN 9 8  CREATININE 0.68 0.73  CALCIUM 8.7 9.1   Liver Function Tests:  Recent Labs  10/07/14 1532 10/08/14 0352  AST 27 22  ALT 36* 32  ALKPHOS 53 56  BILITOT 0.8 0.6  PROT 6.3 6.0  ALBUMIN 3.8 3.6    Recent Labs  10/06/14 1926  LIPASE 32   CBC:  Recent Labs  10/06/14 1504 10/08/14 0352  WBC 8.4 7.3  HGB 14.4 13.6  HCT 40.9 40.4  MCV 87.2 90.0  PLT 205 199   Cardiac Enzymes:  Recent Labs  10/06/14 1926 10/06/14 2205 10/07/14 0109  TROPONINI <0.03 <0.03 <0.03   BNP: Invalid input(s): POCBNP D-Dimer:  Recent Labs  10/06/14 1926  DDIMER 0.98*   Hemoglobin A1C:  Recent Labs  10/06/14 1926  HGBA1C 5.4   Fasting Lipid Panel:  Recent Labs  10/07/14 0109  CHOL 218*  HDL 34*    LDLCALC 143*  TRIG 205*  CHOLHDL 6.4   Thyroid Function Tests:  Recent Labs  10/06/14 1926  TSH 1.195   Anemia Panel: No results for input(s): VITAMINB12, FOLATE, FERRITIN, TIBC, IRON, RETICCTPCT in the last 72 hours. Coag Panel:   No results found for: INR, PROTIME  RADIOLOGY: Dg Chest 2 View  10/06/2014   CLINICAL DATA:  Chest pain, midsternal sharp pain beginning today.  EXAM: CHEST  2 VIEW  COMPARISON:  10/24/2013  FINDINGS: The heart size and mediastinal contours are within normal limits. Both lungs are clear. The visualized skeletal structures are unremarkable.  IMPRESSION: No active cardiopulmonary disease.   Electronically Signed   By: Charlett NoseKevin  Dover M.D.   On: 10/06/2014 15:48   Ct Angio Chest Pe W/cm &/or Wo Cm  10/07/2014   CLINICAL DATA:  Acute chest pain.  EXAM: CT ANGIOGRAPHY CHEST WITH CONTRAST  TECHNIQUE: Multidetector CT imaging of the chest was performed using the standard protocol during bolus administration of intravenous contrast. Multiplanar CT image reconstructions and MIPs were obtained to evaluate the vascular anatomy.  CONTRAST:  80mL OMNIPAQUE IOHEXOL 350 MG/ML SOLN  COMPARISON:  Chest radiograph of October 06, 2014.  FINDINGS: No pneumothorax or pleural effusion is noted. Minimal subsegmental atelectasis is noted posteriorly in the right lung base. There is no evidence of pulmonary embolus. There is no evidence of thoracic aortic dissection or aneurysm. Visualized portion of upper abdomen is unremarkable. No significant mediastinal mass or adenopathy is noted. No significant osseous abnormality is noted in the chest.  Review of the MIP images confirms the above findings.  IMPRESSION: No evidence of pulmonary embolus. No significant acute abnormality seen in the chest.   Electronically Signed   By: Lupita Raider, M.D.   On: 10/07/2014 08:52   ASSESSMENT AND PLAN: 59 yo with history of obesity and hyperlipidemia was admitted with chest pain. Pain was atypical,  prolonged, and associated with negative cardiac enzymes and a normal ECG. She has a history of GERD, cannot rule this out as an etiology. She had elevated D dimer and chest CT was negative for PE.    - Would treat with PPI for reflux.  - Given prolonged pain yesterday with negative enzymes and normal ECG, suspect probably non-cardiac pain. 2D echo showed normal LVF with no wall motion abnormalities or valvular heart disease. Patient requested that nuclear stress test be done in hospital so she is getting nuclear stress test today. - She will need ASA 81, and with elevated LDL would start atorvastatin 20 mg daily for home.    Quintella Reichert, MD  10/08/2014  10:42 AM

## 2014-10-08 NOTE — Progress Notes (Signed)
Patient tolerated the lexiscan well.  Hildegarde Dunaway, PAC

## 2014-10-08 NOTE — Discharge Summary (Signed)
Physician Discharge Summary  Theresa Montgomery:[id number] DOB: 06-06-1956 DOA: 10/06/2014  PCP: No PCP Per Patient  Admit date: 10/06/2014 Discharge date: 10/08/2014  Recommendations for Outpatient Follow-up:  1. Follow-up with primary care doctor 1-2 weeks  Discharge Diagnoses:  Principal Problem:   Gastroenteritis Active Problems:   Chest pain   Overweight   Diverticulitis   Pain in the chest   Nausea and vomiting   Gastritis   Discharge Condition: Stable, improved  Diet recommendation: Healthy heart  Wt Readings from Last 3 Encounters:  10/08/14 102.241 kg (225 lb 6.4 oz)    History of present illness:  Theresa Montgomery is a 59 y.o. female with PMH significant for obesity, prior hx of small MI, HLD, diverticulitis and hay fever who presented to ED complaining of CP that started on the morning of admission lasting approx 15-20 minutes and gradually worsening in intensity. Patient reported pain in the middle of her chest and radiated to left side and back; sharp and 10/10 in intensity. Patient reported associated diaphoresis and nausea when pain was present. Pain improved with NTG and morphine given in ED. She denied palpitations, cough, fever, chills, vomiting, blurred vision, melena, hematemesis, hematochezia or any other complaints. She did mention some sour taste in her mouth and some minor discomfort whe she first woke up; but that went away. Of note, she stated that her pain was like the pain she had with MI she had in 2011. In ED troponin neg, neg CXR, no acute ischemic changes in EKG and normal vital signs. She was seen by cardiology.  Echo demonstrated preserved ejection fraction 60-65% normal systolic function, grade 1 diastolic dysfunction, no significant valvular abnormalities.  Nuclear medicine stress test demonstrated no areas of reversible ischemia.  Her nausea and vomiting improved with PPI and GI cocktail.  Hospital Course:   Chest pain likely due to  Gastritis/gastroenteritis in patient with CAD. - D-dimer elevated - CTA chest negative for PE or dissection  - ECHO wnl - NM stress test:  negative - continued ASA, but started high dose statin and BB due to history of CAD - Tele: NSR - Troponins: neg Lab Results  Component Value Date   CHOL 218* 10/07/2014   HDL 34* 10/07/2014   LDLCALC 143* 10/07/2014   TRIG 205* 10/07/2014   CHOLHDL 6.4 10/07/2014   Nausea and vomiting, suspect gastroenteritis and side effect from pain medication - Lipase wnl - LFT wnl - PPI with GI cocktail prn  GERD, off PPI for last 1.5 months - resume PPI  Obese, BMI 36 - low calorie diet and exercise discussed with patient  Allergic rhinitis: continue claritin and PRN refreshing eye drops  HLD, HDL 143, started on statin  Right lower extremity pain with elevated D-dimer -  Duplex negative for DVT  Procedures:  ECHO  NM stress test  duplex  Consultations:  Cardiology, Dr. Mayford Knife  Discharge Exam: Filed Vitals:   10/08/14 1554  BP: 116/81  Pulse: 63  Temp: 97.8 F (36.6 C)  Resp: 18   Filed Vitals:   10/08/14 1052 10/08/14 1054 10/08/14 1250 10/08/14 1554  BP: 130/74 135/76 117/67 116/81  Pulse:   68 63  Temp:    97.8 F (36.6 C)  TempSrc:    Oral  Resp:    18  Height:      Weight:      SpO2:    98%     HEENT: NCAT, MMM, sitting at edge of bed  Cardiovascular:  RRR, nl S1, S2 no mrg, 2+ pulses, warm extremities  Respiratory: CTAB, no increased WOB  Abdomen: NABS, soft, NT/ND  MSK: Normal tone and bulk, no LEE, pain with palpation of posterior right calf  Neuro: Grossly intact  Discharge Instructions      Discharge Instructions    Call MD for:  difficulty breathing, headache or visual disturbances    Complete by:  As directed      Call MD for:  extreme fatigue    Complete by:  As directed      Call MD for:  hives    Complete by:  As directed      Call MD for:  persistant dizziness or  light-headedness    Complete by:  As directed      Call MD for:  persistant nausea and vomiting    Complete by:  As directed      Call MD for:  severe uncontrolled pain    Complete by:  As directed      Call MD for:  temperature >100.4    Complete by:  As directed      Diet - low sodium heart healthy    Complete by:  As directed      Discharge instructions    Complete by:  As directed   You had some chest pains which are likely not related to your heart.  Your heart monitor, blood tests, ultrasound (ECHO), and stress test were all okay.  You may have some gastritis causing your chest pains and/or some gastroenteritis.  Please take protonix twice a day for the next month to see if that helps.  You may take maalox for breakthrough nausea.  You have had a heart attack, so you should be taking some "standard of care" medications which have been shown to help people live longer and reduce the risk of stroke and heart attack:  Carvedilol and atorvastatin.  I have given you prescriptions for these medications.  Please talk to your primary care doctor about continuing these medications and refills.     Increase activity slowly    Complete by:  As directed             Medication List    TAKE these medications        ALLERGY PO  Take 1 tablet by mouth daily.     aspirin EC 81 MG tablet  Take 81 mg by mouth daily.     atorvastatin 20 MG tablet  Commonly known as:  LIPITOR  Take 1 tablet (20 mg total) by mouth daily at 6 PM.     carboxymethylcellulose 0.5 % Soln  Commonly known as:  REFRESH PLUS  Place 1 drop into both eyes 2 (two) times daily as needed (irritation).     carvedilol 3.125 MG tablet  Commonly known as:  COREG  Take 1 tablet (3.125 mg total) by mouth 2 (two) times daily with a meal.     Fish Oil 1000 MG Caps  Take 2,000 mg by mouth daily.     nitroGLYCERIN 0.4 MG SL tablet  Commonly known as:  NITROSTAT  Place 0.4 mg under the tongue every 5 (five) minutes as needed  for chest pain.     ondansetron 4 MG tablet  Commonly known as:  ZOFRAN  Take 1 tablet (4 mg total) by mouth every 8 (eight) hours as needed for nausea or vomiting.     pantoprazole 40 MG tablet  Commonly known as:  PROTONIX  Take 1 tablet (40 mg total) by mouth 2 (two) times daily before a meal.       Follow-up Information    Follow up with primary care doctor In 2 weeks.       The results of significant diagnostics from this hospitalization (including imaging, microbiology, ancillary and laboratory) are listed below for reference.    Significant Diagnostic Studies: Dg Chest 2 View  10/06/2014   CLINICAL DATA:  Chest pain, midsternal sharp pain beginning today.  EXAM: CHEST  2 VIEW  COMPARISON:  10/24/2013  FINDINGS: The heart size and mediastinal contours are within normal limits. Both lungs are clear. The visualized skeletal structures are unremarkable.  IMPRESSION: No active cardiopulmonary disease.   Electronically Signed   By: Charlett Nose M.D.   On: 10/06/2014 15:48   Ct Angio Chest Pe W/cm &/or Wo Cm  10/07/2014   CLINICAL DATA:  Acute chest pain.  EXAM: CT ANGIOGRAPHY CHEST WITH CONTRAST  TECHNIQUE: Multidetector CT imaging of the chest was performed using the standard protocol during bolus administration of intravenous contrast. Multiplanar CT image reconstructions and MIPs were obtained to evaluate the vascular anatomy.  CONTRAST:  80mL OMNIPAQUE IOHEXOL 350 MG/ML SOLN  COMPARISON:  Chest radiograph of October 06, 2014.  FINDINGS: No pneumothorax or pleural effusion is noted. Minimal subsegmental atelectasis is noted posteriorly in the right lung base. There is no evidence of pulmonary embolus. There is no evidence of thoracic aortic dissection or aneurysm. Visualized portion of upper abdomen is unremarkable. No significant mediastinal mass or adenopathy is noted. No significant osseous abnormality is noted in the chest.  Review of the MIP images confirms the above findings.   IMPRESSION: No evidence of pulmonary embolus. No significant acute abnormality seen in the chest.   Electronically Signed   By: Lupita Raider, M.D.   On: 10/07/2014 08:52   Nm Myocar Multi W/spect W/wall Motion / Ef  10/08/2014   CLINICAL DATA:  Chest pain.  Diverticulitis.  EXAM: MYOCARDIAL IMAGING WITH SPECT (REST AND PHARMACOLOGIC-STRESS)  GATED LEFT VENTRICULAR WALL MOTION STUDY  LEFT VENTRICULAR EJECTION FRACTION  TECHNIQUE: Standard myocardial SPECT imaging was performed after resting intravenous injection of 10 mCi Tc-52m sestamibi. Subsequently, intravenous infusion of Lexiscan was performed under the supervision of the Cardiology staff. At peak effect of the drug, 30 mCi Tc-81m sestamibi was injected intravenously and standard myocardial SPECT imaging was performed. Quantitative gated imaging was also performed to evaluate left ventricular wall motion, and estimate left ventricular ejection fraction.  COMPARISON:  CT of 10/07/2014. Prior nuclear medicine study of 12/01/2006  FINDINGS: Perfusion: Mild apical thinning versus less likely remote infarct, as evidenced by decreased rest uptake. No areas of reversibility to suggest inducible ischemia.  Wall Motion: Normal left ventricular wall motion. No left ventricular dilation.  Left Ventricular Ejection Fraction: 64 %  End diastolic volume 72 ml  End systolic volume 26 ml  IMPRESSION: 1. No reversible ischemia. Left apical thinning versus less likely remote infarct.  2. Normal left ventricular wall motion.  3. Left ventricular ejection fraction 64%  4. Low-risk stress test findings*.  *2012 Appropriate Use Criteria for Coronary Revascularization Focused Update: J Am Coll Cardiol. 2012;59(9):857-881. http://content.dementiazones.com.aspx?articleid=[email or web address]   Electronically Signed   By: Jeronimo Greaves M.D.   On: 10/08/2014 16:25    Microbiology: No results found for this or any previous visit (from the past 240 hour(s)).   Labs: Basic Metabolic  Panel:  Recent Labs Lab 10/06/14 1504 10/07/14 0109 10/08/14 1610  NA 137 136 141  K 3.7 3.6 4.2  CL 104 106 105  CO2 GLUCOSE 98 104* 106*  BUN CREATININE 0.74 0.68 0.73  CALCIUM 9.3 8.7 9.1   Liver Function Tests:  Recent Labs Lab 10/07/14 1532 10/08/14 0352  AST 27 22  ALT 36* 32  ALKPHOS 53 56  BILITOT 0.8 0.6  PROT 6.3 6.0  ALBUMIN 3.8 3.6    Recent Labs Lab 10/06/14 1926  LIPASE 32   No results for input(s): AMMONIA in the last 168 hours. CBC:  Recent Labs Lab 10/06/14 1504 10/08/14 0352  WBC 8.4 7.3  HGB 14.4 13.6  HCT 40.9 40.4  MCV 87.2 90.0  PLT 205 199   Cardiac Enzymes:  Recent Labs Lab 10/06/14 1926 10/06/14 2205 10/07/14 0109  TROPONINI <0.03 <0.03 <0.03   BNP: BNP (last 3 results) No results for input(s): BNP in the last 8760 hours.  ProBNP (last 3 results) No results for input(s): PROBNP in the last 8760 hours.  CBG: No results for input(s): GLUCAP in the last 168 hours.  Time coordinating discharge: 35 minutes  Signed:  Karson Reede  [facility] 10/08/2014, 5:14 PM

## 2014-10-08 NOTE — Progress Notes (Signed)
Right lower extremity venous duplex completed.  Right:  No evidence of DVT, superficial thrombosis, or Baker's cyst.  Left:  Negative for DVT in the common femoral vein.  

## 2015-09-13 ENCOUNTER — Emergency Department (HOSPITAL_COMMUNITY)
Admission: EM | Admit: 2015-09-13 | Discharge: 2015-09-13 | Disposition: A | Discharge status: Home / Self Care | Payer: Self-pay | Attending: Emergency Medicine | Admitting: Emergency Medicine

## 2015-09-13 ENCOUNTER — Emergency Department (HOSPITAL_COMMUNITY): Payer: Self-pay

## 2015-09-13 DIAGNOSIS — R079 Chest pain, unspecified: Secondary | ICD-10-CM | POA: Insufficient documentation

## 2015-09-13 DIAGNOSIS — Z7982 Long term (current) use of aspirin: Secondary | ICD-10-CM | POA: Insufficient documentation

## 2015-09-13 DIAGNOSIS — Z9049 Acquired absence of other specified parts of digestive tract: Secondary | ICD-10-CM | POA: Insufficient documentation

## 2015-09-13 DIAGNOSIS — Z880 Allergy status to penicillin: Secondary | ICD-10-CM | POA: Insufficient documentation

## 2015-09-13 DIAGNOSIS — I252 Old myocardial infarction: Secondary | ICD-10-CM | POA: Insufficient documentation

## 2015-09-13 DIAGNOSIS — Z8719 Personal history of other diseases of the digestive system: Secondary | ICD-10-CM | POA: Insufficient documentation

## 2015-09-13 DIAGNOSIS — R1084 Generalized abdominal pain: Secondary | ICD-10-CM | POA: Insufficient documentation

## 2015-09-13 DIAGNOSIS — R109 Unspecified abdominal pain: Secondary | ICD-10-CM

## 2015-09-13 DIAGNOSIS — Z79899 Other long term (current) drug therapy: Secondary | ICD-10-CM | POA: Insufficient documentation

## 2015-09-13 LAB — COMPREHENSIVE METABOLIC PANEL
ALBUMIN: 5.1 g/dL — AB (ref 3.5–5.0)
ALT: 55 U/L — AB (ref 14–54)
AST: 40 U/L (ref 15–41)
Alkaline Phosphatase: 74 U/L (ref 38–126)
Anion gap: 11 (ref 5–15)
BUN: 12 mg/dL (ref 6–20)
CALCIUM: 10 mg/dL (ref 8.9–10.3)
CO2: 25 mmol/L (ref 22–32)
CREATININE: 0.8 mg/dL (ref 0.44–1.00)
Chloride: 104 mmol/L (ref 101–111)
GFR calc Af Amer: >60 mL/min (ref >60)
GFR calc non Af Amer: >60 mL/min (ref >60)
Glucose, Bld: 102 mg/dL — ABNORMAL HIGH (ref 65–99)
Potassium: 3.8 mmol/L (ref 3.5–5.1)
SODIUM: 140 mmol/L (ref 135–145)
Total Bilirubin: 0.8 mg/dL (ref 0.3–1.2)
Total Protein: 8 g/dL (ref 6.5–8.1)

## 2015-09-13 LAB — CBC WITH DIFFERENTIAL/PLATELET
BASOS ABS: 0 10*3/uL (ref 0.0–0.1)
BASOS PCT: 0 %
EOS ABS: 0.2 10*3/uL (ref 0.0–0.7)
Eosinophils Relative: 2 %
HCT: 45.5 % (ref 36.0–46.0)
HEMOGLOBIN: 15.8 g/dL — AB (ref 12.0–15.0)
Lymphocytes Relative: 28 %
Lymphs Abs: 3.1 10*3/uL (ref 0.7–4.0)
MCH: 30.8 pg (ref 26.0–34.0)
MCHC: 34.7 g/dL (ref 30.0–36.0)
MCV: 88.7 fL (ref 78.0–100.0)
Monocytes Absolute: 1.1 10*3/uL — ABNORMAL HIGH (ref 0.1–1.0)
Monocytes Relative: 10 %
Neutro Abs: 6.5 10*3/uL (ref 1.7–7.7)
Neutrophils Relative %: 60 %
Platelets: 233 10*3/uL (ref 150–400)
RBC: 5.13 MIL/uL — AB (ref 3.87–5.11)
RDW: 13 % (ref 11.5–15.5)
WBC: 11 10*3/uL — AB (ref 4.0–10.5)

## 2015-09-13 LAB — URINALYSIS, ROUTINE W REFLEX MICROSCOPIC
BILIRUBIN URINE: NEGATIVE
Glucose, UA: NEGATIVE mg/dL
HGB URINE DIPSTICK: NEGATIVE
Ketones, ur: NEGATIVE mg/dL
Leukocytes, UA: NEGATIVE
Nitrite: NEGATIVE
Protein, ur: NEGATIVE mg/dL
SPECIFIC GRAVITY, URINE: 1.006 (ref 1.005–1.030)
pH: 5.5 (ref 5.0–8.0)

## 2015-09-13 LAB — I-STAT TROPONIN, ED: Troponin i, poc: 0 ng/mL (ref 0.00–0.08)

## 2015-09-13 LAB — LIPASE, BLOOD: Lipase: 33 U/L (ref 11–51)

## 2015-09-13 MED ORDER — HYDROCODONE-ACETAMINOPHEN 5-325 MG PO TABS
1.0000 | ORAL_TABLET | Freq: Four times a day (QID) | ORAL | Status: AC | PRN
Start: 2015-09-13 — End: ?

## 2015-09-13 MED ORDER — IOHEXOL 300 MG/ML  SOLN
25.0000 mL | Freq: Once | INTRAMUSCULAR | Status: AC | PRN
  Administered 2015-09-13: 25 mL via ORAL

## 2015-09-13 MED ORDER — IOHEXOL 300 MG/ML  SOLN
100.0000 mL | Freq: Once | INTRAMUSCULAR | Status: AC | PRN
  Administered 2015-09-13: 100 mL via INTRAVENOUS

## 2015-09-13 MED ORDER — HYDROMORPHONE HCL 1 MG/ML IJ SOLN
1.0000 mg | Freq: Once | INTRAMUSCULAR | Status: AC
  Administered 2015-09-13: 1 mg via INTRAVENOUS
  Filled 2015-09-13: qty 1

## 2015-09-13 MED ORDER — ONDANSETRON HCL 4 MG/2ML IJ SOLN
4.0000 mg | Freq: Once | INTRAMUSCULAR | Status: AC
  Administered 2015-09-13: 4 mg via INTRAVENOUS
  Filled 2015-09-13: qty 2

## 2015-09-13 MED ORDER — PANTOPRAZOLE SODIUM 40 MG IV SOLR
40.0000 mg | INTRAVENOUS | Status: AC
  Administered 2015-09-13: 40 mg via INTRAVENOUS
  Filled 2015-09-13: qty 40

## 2015-09-13 MED ORDER — SUCRALFATE 1 GM/10ML PO SUSP: 1.0000 g | Freq: Three times a day (TID) | ORAL | Status: AC

## 2015-09-13 NOTE — Discharge Instructions (Signed)

## 2015-09-13 NOTE — ED Notes (Signed)
Pt complains of nausea, pain in her chest, upper back and lower abdominal pain since Monday. Pt states she feels short of breath with exertion. Pt states she has had some diarrhea today.

## 2015-09-13 NOTE — ED Provider Notes (Signed)
CSN: [id number]     Arrival date & time 09/13/15  1749 History   First MD Initiated Contact with Patient 09/13/15 1840     Chief Complaint  Patient presents with  . Chest Pain  . Abdominal Pain     (Consider location/radiation/quality/duration/timing/severity/associated sxs/prior Treatment) HPI Comments: Patient presents to the emergency department with chief complaint of chest pain and abdominal pain. She states the symptoms started on Monday. Regarding the chest pain, she states that she has some exertional shortness of breath, and intermittent chest pain. Regarding the abdominal pain, she states that she has generalized abdominal pain. It is moderate to severe. She reports associated nausea, vomiting, and diarrhea. She states that her pain is moderate. She denies any dysuria. She reports extensive history of diverticulitis. Prior abdominal surgeries include cholecystectomy and appendectomy.  The history is provided by the patient. No language interpreter was used.    Past Medical History  Diagnosis Date  . MI (myocardial infarction)   . Diverticulitis    Past Surgical History  Procedure Laterality Date  . Knee arthroplasty Bilateral   . Cholecystectomy     No family history on file. Social History  Substance Use Topics  . Smoking status: Never Smoker   . Smokeless tobacco: Not on file  . Alcohol Use: No   OB History    No data available     Review of Systems  All other systems reviewed and are negative.     Allergies  Adhesive and Penicillins  Home Medications   Prior to Admission medications   Medication Sig Start Date End Date Taking? Authorizing Provider  aspirin EC 81 MG tablet Take 81 mg by mouth daily.    Historical Provider, MD  atorvastatin (LIPITOR) 20 MG tablet Take 1 tablet (20 mg total) by mouth daily at 6 PM. 10/08/14   Renae Fickle, MD  carboxymethylcellulose (REFRESH PLUS) 0.5 % SOLN Place 1 drop into both eyes 2 (two) times daily as needed  (irritation).    Historical Provider, MD  carvedilol (COREG) 3.125 MG tablet Take 1 tablet (3.125 mg total) by mouth 2 (two) times daily with a meal. 10/08/14   Renae Fickle, MD  Chlorpheniramine Maleate (ALLERGY PO) Take 1 tablet by mouth daily.    Historical Provider, MD  nitroGLYCERIN (NITROSTAT) 0.4 MG SL tablet Place 0.4 mg under the tongue every 5 (five) minutes as needed for chest pain.    Historical Provider, MD  Omega-3 Fatty Acids (FISH OIL) 1000 MG CAPS Take 2,000 mg by mouth daily.    Historical Provider, MD  ondansetron (ZOFRAN) 4 MG tablet Take 1 tablet (4 mg total) by mouth every 8 (eight) hours as needed for nausea or vomiting. 10/08/14   Renae Fickle, MD  pantoprazole (PROTONIX) 40 MG tablet Take 1 tablet (40 mg total) by mouth 2 (two) times daily before a meal. 10/08/14   Renae Fickle, MD   BP 146/87 mmHg  Pulse 88  Temp(Src) 97.7 F (36.5 C) (Oral)  Resp 18  SpO2 99% Physical Exam  Constitutional: She is oriented to person, place, and time. She appears well-developed and well-nourished.  HENT:  Head: Normocephalic and atraumatic.  Eyes: Conjunctivae and EOM are normal. Pupils are equal, round, and reactive to light.  Neck: Normal range of motion. Neck supple.  Cardiovascular: Normal rate and regular rhythm.  Exam reveals no gallop and no friction rub.   No murmur heard. Pulmonary/Chest: Effort normal and breath sounds normal. No respiratory distress. She has no wheezes.  She has no rales. She exhibits no tenderness.  Abdominal: Soft. Bowel sounds are normal. She exhibits no distension and no mass. There is tenderness. There is no rebound and no guarding.  Moderate diffuse abdominal tenderness  Musculoskeletal: Normal range of motion. She exhibits no edema or tenderness.  Neurological: She is alert and oriented to person, place, and time.  Skin: Skin is warm and dry.  Psychiatric: She has a normal mood and affect. Her behavior is normal. Judgment and thought content  normal.  Nursing note and vitals reviewed.   ED Course  Procedures (including critical care time) Results for orders placed or performed during the hospital encounter of 09/13/15  CBC with Differential/Platelet  Result Value Ref Range   WBC 11.0 (H) 4.0 - 10.5 K/uL   RBC 5.13 (H) 3.87 - 5.11 MIL/uL   Hemoglobin 15.8 (H) 12.0 - 15.0 g/dL   HCT 69.6 29.5 - 28.4 %   MCV 88.7 78.0 - 100.0 fL   MCH 30.8 26.0 - 34.0 pg   MCHC 34.7 30.0 - 36.0 g/dL   RDW 13.2 44.0 - 10.2 %   Platelets 233 150 - 400 K/uL   Neutrophils Relative % 60 %   Neutro Abs 6.5 1.7 - 7.7 K/uL   Lymphocytes Relative 28 %   Lymphs Abs 3.1 0.7 - 4.0 K/uL   Monocytes Relative 10 %   Monocytes Absolute 1.1 (H) 0.1 - 1.0 K/uL   Eosinophils Relative 2 %   Eosinophils Absolute 0.2 0.0 - 0.7 K/uL   Basophils Relative 0 %   Basophils Absolute 0.0 0.0 - 0.1 K/uL  Comprehensive metabolic panel  Result Value Ref Range   Sodium 140 135 - 145 mmol/L   Potassium 3.8 3.5 - 5.1 mmol/L   Chloride 104 101 - 111 mmol/L   CO2 25 22 - 32 mmol/L   Glucose, Bld 102 (H) 65 - 99 mg/dL   BUN 12 6 - 20 mg/dL   Creatinine, Ser 7.25 0.44 - 1.00 mg/dL   Calcium 36.6 8.9 - 44.0 mg/dL   Total Protein 8.0 6.5 - 8.1 g/dL   Albumin 5.1 (H) 3.5 - 5.0 g/dL   AST 40 15 - 41 U/L   ALT 55 (H) 14 - 54 U/L   Alkaline Phosphatase 74 38 - 126 U/L   Total Bilirubin 0.8 0.3 - 1.2 mg/dL   GFR calc non Af Amer >60 >60 mL/min   GFR calc Af Amer >60 >60 mL/min   Anion gap 11 5 - 15  Lipase, blood  Result Value Ref Range   Lipase 33 11 - 51 U/L  Urinalysis, Routine w reflex microscopic (not at [facility])  Result Value Ref Range   Color, Urine YELLOW YELLOW   APPearance CLEAR CLEAR   Specific Gravity, Urine 1.006 1.005 - 1.030   pH 5.5 5.0 - 8.0   Glucose, UA NEGATIVE NEGATIVE mg/dL   Hgb urine dipstick NEGATIVE NEGATIVE   Bilirubin Urine NEGATIVE NEGATIVE   Ketones, ur NEGATIVE NEGATIVE mg/dL   Protein, ur NEGATIVE NEGATIVE mg/dL   Nitrite NEGATIVE  NEGATIVE   Leukocytes, UA NEGATIVE NEGATIVE  I-Stat Troponin, ED (not at [facility])  Result Value Ref Range   Troponin i, poc 0.00 0.00 - 0.08 ng/mL   Comment 3           Dg Chest 2 View  09/13/2015  CLINICAL DATA:  Chest pain EXAM: CHEST  2 VIEW COMPARISON:  10/06/2014 FINDINGS: The heart size and mediastinal contours are within  normal limits. Both lungs are clear. The visualized skeletal structures are unremarkable. IMPRESSION: No active cardiopulmonary disease. Electronically Signed   By: Marlan Palau M.D.   On: 09/13/2015 18:40   Ct Abdomen Pelvis W Contrast  09/13/2015  CLINICAL DATA:  Abdominal pain EXAM: CT ABDOMEN AND PELVIS WITH CONTRAST TECHNIQUE: Multidetector CT imaging of the abdomen and pelvis was performed using the standard protocol following bolus administration of intravenous contrast. CONTRAST:  25mL OMNIPAQUE IOHEXOL 300 MG/ML SOLN, OMNIPAQUE IOHEXOL 300 MG/ML SOLN COMPARISON:  None. FINDINGS: Lower chest:  Lung bases are clear without infiltrate or effusion Hepatobiliary: Liver homogeneous and density. Normal size and contour. Prior cholecystectomy. Bile ducts nondilated. Pancreas: Negative Spleen: Negative Adrenals/Urinary Tract: Normal kidneys. No renal mass or obstruction or stone. Urinary bladder normal and empty. Stomach/Bowel: Negative for bowel obstruction. Appendix not visualized and possibly surgically absent. No evidence of acute appendicitis. Sigmoid diverticulosis without diverticulitis. Negative for bowel edema or mass. Vascular/Lymphatic: Minimal atherosclerotic calcification in the abdominal aorta without aneurysm. No lymphadenopathy. Reproductive: Hysterectomy changes.  Negative for pelvic mass. Other: No free fluid. Musculoskeletal: No skeletal lesion. IMPRESSION: No acute abnormality. Cholecystectomy changes. Appendix not visualized and may be surgically absent. Diverticulosis without diverticulitis. Electronically Signed   By: Marlan Palau M.D.   On: 09/13/2015  20:47     Imaging Review Dg Chest 2 View  09/13/2015  CLINICAL DATA:  Chest pain EXAM: CHEST  2 VIEW COMPARISON:  10/06/2014 FINDINGS: The heart size and mediastinal contours are within normal limits. Both lungs are clear. The visualized skeletal structures are unremarkable. IMPRESSION: No active cardiopulmonary disease. Electronically Signed   By: Marlan Palau M.D.   On: 09/13/2015 18:40   I have personally reviewed and evaluated these images and lab results as part of my medical decision-making.    MDM   Final diagnoses:  Abdominal pain, unspecified abdominal location    Patient with chest pain and abdominal pain. Check labs, imaging, and reassess.  Patient feels somewhat improved. Urinalysis negative. Troponin is negative. Mild leukocytosis to 11. Normal electrolytes. Lipase is normal. No evidence for acute process on CT scan. Chest x-ray is also negative. Patient states that she has had a lot of acid reflux. I'm concerned about GERD versus peptic ulcer disease. Will give Carafate. Patient is taking Zantac at home. Recommend GI/PCP follow-up.    Roxy Horseman, PA-C 09/13/15 2204  Benjiman Core, MD 09/14/15 [phone]

## 2015-09-13 NOTE — ED Notes (Signed)
MD at bedside. 

## 2015-09-19 ENCOUNTER — Encounter (HOSPITAL_COMMUNITY): Payer: Self-pay

## 2015-09-19 ENCOUNTER — Emergency Department (HOSPITAL_COMMUNITY)
Admission: EM | Admit: 2015-09-19 | Discharge: 2015-09-19 | Disposition: A | Discharge status: Home / Self Care | Payer: MEDICAID | Attending: Emergency Medicine | Admitting: Emergency Medicine

## 2015-09-19 DIAGNOSIS — Z880 Allergy status to penicillin: Secondary | ICD-10-CM | POA: Insufficient documentation

## 2015-09-19 DIAGNOSIS — I252 Old myocardial infarction: Secondary | ICD-10-CM | POA: Insufficient documentation

## 2015-09-19 DIAGNOSIS — Z8719 Personal history of other diseases of the digestive system: Secondary | ICD-10-CM | POA: Insufficient documentation

## 2015-09-19 DIAGNOSIS — R1084 Generalized abdominal pain: Secondary | ICD-10-CM | POA: Insufficient documentation

## 2015-09-19 DIAGNOSIS — R110 Nausea: Secondary | ICD-10-CM | POA: Insufficient documentation

## 2015-09-19 DIAGNOSIS — Z90710 Acquired absence of both cervix and uterus: Secondary | ICD-10-CM | POA: Insufficient documentation

## 2015-09-19 DIAGNOSIS — R197 Diarrhea, unspecified: Secondary | ICD-10-CM

## 2015-09-19 DIAGNOSIS — Z7982 Long term (current) use of aspirin: Secondary | ICD-10-CM | POA: Insufficient documentation

## 2015-09-19 DIAGNOSIS — R109 Unspecified abdominal pain: Secondary | ICD-10-CM

## 2015-09-19 DIAGNOSIS — Z9049 Acquired absence of other specified parts of digestive tract: Secondary | ICD-10-CM | POA: Insufficient documentation

## 2015-09-19 DIAGNOSIS — R6883 Chills (without fever): Secondary | ICD-10-CM | POA: Insufficient documentation

## 2015-09-19 DIAGNOSIS — Z79899 Other long term (current) drug therapy: Secondary | ICD-10-CM | POA: Insufficient documentation

## 2015-09-19 LAB — URINE MICROSCOPIC-ADD ON

## 2015-09-19 LAB — COMPREHENSIVE METABOLIC PANEL
ALBUMIN: 4.8 g/dL (ref 3.5–5.0)
ALT: 52 U/L (ref 14–54)
ANION GAP: 11 (ref 5–15)
AST: 26 U/L (ref 15–41)
Alkaline Phosphatase: 74 U/L (ref 38–126)
BUN: 14 mg/dL (ref 6–20)
CO2: 22 mmol/L (ref 22–32)
Calcium: 9.8 mg/dL (ref 8.9–10.3)
Chloride: 107 mmol/L (ref 101–111)
Creatinine, Ser: 0.77 mg/dL (ref 0.44–1.00)
GFR calc Af Amer: >60 mL/min (ref >60)
GFR calc non Af Amer: >60 mL/min (ref >60)
GLUCOSE: 106 mg/dL — AB (ref 65–99)
POTASSIUM: 4.3 mmol/L (ref 3.5–5.1)
SODIUM: 140 mmol/L (ref 135–145)
Total Bilirubin: 0.7 mg/dL (ref 0.3–1.2)
Total Protein: 7.8 g/dL (ref 6.5–8.1)

## 2015-09-19 LAB — URINALYSIS, ROUTINE W REFLEX MICROSCOPIC
BILIRUBIN URINE: NEGATIVE
Glucose, UA: NEGATIVE mg/dL
KETONES UR: NEGATIVE mg/dL
NITRITE: POSITIVE — AB
PH: 5 (ref 5.0–8.0)
Protein, ur: NEGATIVE mg/dL
Specific Gravity, Urine: 1.026 (ref 1.005–1.030)

## 2015-09-19 LAB — CBC
HEMATOCRIT: 48.4 % — AB (ref 36.0–46.0)
HEMOGLOBIN: 16.5 g/dL — AB (ref 12.0–15.0)
MCH: 31.3 pg (ref 26.0–34.0)
MCHC: 34.1 g/dL (ref 30.0–36.0)
MCV: 91.7 fL (ref 78.0–100.0)
Platelets: 254 10*3/uL (ref 150–400)
RBC: 5.28 MIL/uL — ABNORMAL HIGH (ref 3.87–5.11)
RDW: 13.2 % (ref 11.5–15.5)
WBC: 11.7 10*3/uL — ABNORMAL HIGH (ref 4.0–10.5)

## 2015-09-19 LAB — LIPASE, BLOOD: Lipase: 25 U/L (ref 11–51)

## 2015-09-19 LAB — I-STAT TROPONIN, ED: Troponin i, poc: 0 ng/mL (ref 0.00–0.08)

## 2015-09-19 MED ORDER — MORPHINE SULFATE (PF) 4 MG/ML IV SOLN
4.0000 mg | Freq: Once | INTRAVENOUS | Status: AC
  Administered 2015-09-19: 4 mg via INTRAVENOUS
  Filled 2015-09-19: qty 1

## 2015-09-19 MED ORDER — ONDANSETRON HCL 4 MG/2ML IJ SOLN
4.0000 mg | INTRAMUSCULAR | Status: AC
  Administered 2015-09-19: 4 mg via INTRAVENOUS
  Filled 2015-09-19: qty 2

## 2015-09-19 MED ORDER — HYDROCODONE-ACETAMINOPHEN 5-325 MG PO TABS: 1.0000 | ORAL_TABLET | ORAL | Status: AC | PRN

## 2015-09-19 MED ORDER — ONDANSETRON 4 MG PO TBDP: 4.0000 mg | ORAL_TABLET | Freq: Three times a day (TID) | ORAL | Status: AC | PRN

## 2015-09-19 MED ORDER — METRONIDAZOLE 500 MG PO TABS: 500.0000 mg | ORAL_TABLET | Freq: Two times a day (BID) | ORAL | Status: AC

## 2015-09-19 MED ORDER — CIPROFLOXACIN HCL 500 MG PO TABS: 500.0000 mg | ORAL_TABLET | Freq: Two times a day (BID) | ORAL | Status: AC

## 2015-09-19 MED ORDER — SODIUM CHLORIDE 0.9 % IV BOLUS (SEPSIS)
1000.0000 mL | Freq: Once | INTRAVENOUS | Status: AC
  Administered 2015-09-19: 1000 mL via INTRAVENOUS

## 2015-09-19 NOTE — Discharge Instructions (Signed)
1. Medications: cipro, flagyl, usual home medications 2. Treatment: rest, drink plenty of fluids 3. Follow Up: please followup with your primary doctor for discussion of your diagnoses and further evaluation after today's visit; if you do not have a primary care doctor use the resource guide provided to find one; please return to the ER for increased pain, new or worsening symptoms   Abdominal Pain, Adult Many things can cause abdominal pain. Usually, abdominal pain is not caused by a disease and will improve without treatment. It can often be observed and treated at home. Your health care provider will do a physical exam and possibly order blood tests and X-rays to help determine the seriousness of your pain. However, in many cases, more time must pass before a clear cause of the pain can be found. Before that point, your health care provider may not know if you need more testing or further treatment. HOME CARE INSTRUCTIONS Monitor your abdominal pain for any changes. The following actions may help to alleviate any discomfort you are experiencing:  Only take over-the-counter or prescription medicines as directed by your health care provider.  Do not take laxatives unless directed to do so by your health care provider.  Try a clear liquid diet (broth, tea, or water) as directed by your health care provider. Slowly move to a bland diet as tolerated. SEEK MEDICAL CARE IF:  You have unexplained abdominal pain.  You have abdominal pain associated with nausea or diarrhea.  You have pain when you urinate or have a bowel movement.  You experience abdominal pain that wakes you in the night.  You have abdominal pain that is worsened or improved by eating food.  You have abdominal pain that is worsened with eating fatty foods.  You have a fever. SEEK IMMEDIATE MEDICAL CARE IF:  Your pain does not go away within 2 hours.  You keep throwing up (vomiting).  Your pain is felt only in portions  of the abdomen, such as the right side or the left lower portion of the abdomen.  You pass bloody or black tarry stools. MAKE SURE YOU:  Understand these instructions.  Will watch your condition.  Will get help right away if you are not doing well or get worse.   This information is not intended to replace advice given to you by your health care provider. Make sure you discuss any questions you have with your health care provider.   Document Released: 05/05/2005 Document Revised: 04/16/2015 Document Reviewed: 04/04/2013 Elsevier Interactive Patient Education 2016 [employer].   Emergency Department Resource Guide 1) Find a Doctor and Pay Out of Pocket Although you won't have to find out who is covered by your insurance plan, it is a good idea to ask around and get recommendations. You will then need to call the office and see if the doctor you have chosen will accept you as a new patient and what types of options they offer for patients who are self-pay. Some doctors offer discounts or will set up payment plans for their patients who do not have insurance, but you will need to ask so you aren't surprised when you get to your appointment.  2) Contact Your Local Health Department Not all health departments have doctors that can see patients for sick visits, but many do, so it is worth a call to see if yours does. If you don't know where your local health department is, you can check in your phone book. The CDC also has  a tool to help you locate your state's health department, and many state websites also have listings of all of their local health departments.  3) Find a Walk-in Clinic If your illness is not likely to be very severe or complicated, you may want to try a walk in clinic. These are popping up all over the country in pharmacies, drugstores, and shopping centers. They're usually staffed by nurse practitioners or physician assistants that have been trained to treat common illnesses  and complaints. They're usually fairly quick and inexpensive. However, if you have serious medical issues or chronic medical problems, these are probably not your best option.  No Primary Care Doctor: - Call Health Connect at  [phone] - they can help you locate a primary care doctor that  accepts your insurance, provides certain services, etc. - Physician Referral Service- [phone]  Chronic Pain Problems: Organization         Address  Phone   Notes  [facility]  [phone] Patients need to be referred by their primary care doctor.   Medication Assistance: Organization         Address  Phone   Notes  [facility] Medication [facility] [address] [place] [address] [place], [place] [address] [phone] --Must be a resident of [facility] -- Must have NO insurance coverage whatsoever (no Medicaid/ Medicare, etc.) -- The pt. MUST have a primary care doctor that directs their care regularly and follows them in the community   [facility]  [phone]   [employer]  [phone]    Agencies that provide inexpensive medical care: Organization         Address  Phone   Notes  [facility]  [phone]   [facility]    [phone]   [facility] [address] [place], [place] [address] [phone]   [facility] [address] [place] [place] [phone]   Planned Parenthood    [phone]   [facility]    [phone]   [facility]  [address], [place] Phone:  [phone], Fax:  [phone] Hours of Operation:  9 am - 6 pm, M-F.  Also accepts Medicaid/Medicare and self-pay.  [facility]  [address], [place] Phone: [phone], Fax: [phone]. Hours of Operation:  8:30 am - 5:30 pm, M-F.  Also accepts Medicaid and self-pay.  [facility] [place] [address], [place] Phone: [phone]   [facility] [address] Natasha Bence [place], [place] [phone], Ext. [phone] Mondays & Thursdays: 7-9 AM.  First 15 patients are seen on a first come, first serve basis.    Medicaid-accepting [facility] Providers:  Organization         Address  Phone   Notes  [facility] [address], [place] [phone] Also accepts self-pay patients.  [facility] [address] Laurell Josephs [place], [place]  [phone]   [facility] [address], [place] [phone]   [facility] [address], [place] [phone]   Renaye Rakers [address], [place]   [phone] Only accepts [place] Access [place] patients after they have their name applied to their card.   Self-Pay (no insurance) in [place]:  Organization         Address  Phone   Notes  Sickle Cell Patients, [facility] [place] [phone]   [facility] [place] [phone]   [facility] [place]  [place], [address],  [phone]   [facility]/Dr. Osei-Bonsu  [address], [place] or [address], [place] [phone] Phone number for both [place] and [place] locations is the same.  [facility] [address], [place] [phone]   [facility] [address], [place] or [address] [phone]   [facility] [address] [place] [phone], phone; [phone], fax Sees patients 1st and 3rd Saturday of every month.  Must not qualify for public or private insurance (i.e. Medicaid, Medicare, [place] Health Choice, Veterans' Benefits)  Household income should be no more than 200% of the poverty level  The clinic cannot treat you if you are pregnant or think you are pregnant  Sexually transmitted diseases are not treated at the clinic.    Dental Care: Organization         Address  Phone  Notes  [facility] Department of [facility] [phone] Accepts children up to age 25 who are enrolled in [place] or [place]; pregnant women with a Medicaid card; and children who have applied for Medicaid or [employer] Health Choice, but were declined, whose parents can pay a reduced fee at time of service.  [facility] Department of [facility]  [address], [place] [phone] Accepts children up to age 78 who are enrolled in [place] or [place]; pregnant women with a Medicaid card; and children who have applied for Medicaid or [place] Health Choice, but were declined, whose parents can pay a reduced fee at time of service.  [facility] PRO[facility]  [place] [phone] Patients are seen by appointment only. Walk-ins are not accepted. [place] will see patients 55 years of age and older. Monday - Tuesday (8am-5pm) Most Wednesdays (8:30-5pm) $30 per visit, cash only  [facility] Adult [facility] Access PROGRAM  [address], [facility] [phone] Patients are seen by appointment only. Walk-ins are not accepted. [place] will see patients 53 years of age and older. One Wednesday Evening (Monthly: Volunteer Based).  $30 per visit, cash only  [place]  [phone] for adults; Children under age 14, call Graduate Pediatric Dentistry at [phone]. Children aged 80-14, please call [phone] to request a pediatric application.  Dental services are provided in all areas of dental care including fillings, crowns and bridges, complete and partial dentures, implants, gum treatment, root canals, and extractions. Preventive care is  also provided. Treatment is provided to both adults and children. Patients are selected via a lottery and there is often a waiting list.   [facility] [address] [place]  [phone] [email or web address]   [facility] [address] [place], [place] [phone], Ext. [phone] Second and Fourth Thursday of each month, opens at 6:30 AM; Clinic ends at 9 AM.  Patients are seen on a first-come first-served basis, and a limited number are seen during each clinic.  [facility]  [address] Hillard Danker [place], [place] [phone]   Eligibility Requirements You must have lived in [place], [place], or [place] counties for at least the last three months.   You cannot be eligible for state or federal sponsored [employer], including [employer], [place], or [employer].   You generally cannot be eligible for healthcare insurance through your employer.    How to apply: Eligibility screenings are held every Tuesday and Wednesday afternoon from 1:00 pm until 4:00 pm. You do not need an appointment for the interview!  [facility] [address], [place], [place]   [place]  [place] Department  [place]  [phone]    Behavioral Health Resources in the Community: Intensive Outpatient Programs Organization         Address  Phone  Notes  [place]. [address] [place], [place] [phone]   [facility] Outpatient [address] [place], [place]   ADS: Alcohol & Drug Svcs [address] [place], [place]   [place] [address],  [place], [place] or [phone]   Substance Abuse Resources Organization         Address  Phone  Notes  Alcohol and Drug Services  [phone]   [place]  [phone]   [facility] [place]   Chinita Pester  [phone]   Residential & Outpatient Substance Abuse Program  [phone]   Psychological Services Organization         Address  Phone  Notes  [facility] [place]  [place]  [phone]   [address], [place] or [phone]    Mobile Crisis Teams Organization         Address  Phone  Notes  [facility], Mobile Crisis Care Unit  [phone]   [facility]  [address] [place], [place]   [place] Levels [address] [place] [phone]    Self-Help/Support Groups Organization         Address  Phone             Notes  [place]. of [place] - variety of support groups  [place] Call for more information  Narcotics Anonymous (NA), [facility] [address], [employer]   2 meetings at this location   Special educational needs teacher         Address  Phone  Notes  [facility] [place],    [place]  [phone]   [facility]  [address], [place] [phone], [place], [place]   [place], [place] [phone] Admissions: 8am-3pm M-F  Incentives Substance [place] [address]    [place], [place] [phone]   [facility] [address] Jadene Pierini [place], [place]   [facility] [address],  [place], [place]   [facility] - Intensive Outpatient [address] Kristeen [address], [place], [place]   [facility] (Omega.) [place].,  [place], [place] or [phone]   [facility] (RTS) [address] [place], [place] Accepts Medicaid  [facility] [place] [address]  [place] [phone]  Substance Abuse/Addiction  [place] Resources Organization         Address  Phone  Notes  [facility]  [phone]   Domenic Schwab, PhD [address] Arlis Porta [place], [place]   [phone] or [phone]   [place], [place] [phone]   [address], [place], [place] [phone] Insurance/Medicaid/sponsorship through [facility] and Families [address], [address] [place]                                    [place], [place] [phone] [place] [address], [place] [phone]    Dr. Adele Schilder  [phone]   [facility] Dept. 1) [address], [place] 2) [place] 3)  [place] [address], [place] [phone]  [phone]   [facility] [phone] or [phone] (After Hours)

## 2015-09-19 NOTE — ED Notes (Signed)
Patient c/o bilateral lower abdominal pain, diarrhea x 10 days. Patient states that she was seen in the ED  6 days ago for the same.

## 2015-09-19 NOTE — ED Provider Notes (Signed)
CSN: [id number]     Arrival date & time 09/19/15  1353 History   First MD Initiated Contact with Patient 09/19/15 1656     Chief Complaint  Patient presents with  . Abdominal Pain  . Diarrhea    HPI   Theresa Montgomery is a 60 y.o. female with a PMH of MI, diverticulitis, appendectomy, cholecystectomy who presents to the ED with lower abdominal pain, which she states has been constant for the past 10 days and has progressively worsened since its onset. She denies exacerbating factors. She has tried her home pain medication without significant symptom relief. She reports associated nausea and diarrhea. She also notes chills. She denies fever, chest pain, shortness of breath, vomiting, hematochezia, melena, dysuria, urgency, frequency, recent travel, recent antibiotic use.   Past Medical History  Diagnosis Date  . MI (myocardial infarction) (HCC)   . Diverticulitis    Past Surgical History  Procedure Laterality Date  . Knee arthroplasty Bilateral   . Cholecystectomy    . Appendectomy    . Abdominal hysterectomy     Family History  Problem Relation Age of Onset  . Cancer Mother   . Rheum arthritis Father    Social History  Substance Use Topics  . Smoking status: Never Smoker   . Smokeless tobacco: Never Used  . Alcohol Use: No   OB History    No data available      Review of Systems  Constitutional: Positive for chills. Negative for fever.  Respiratory: Negative for shortness of breath.   Cardiovascular: Negative for chest pain.  Gastrointestinal: Positive for nausea, abdominal pain and diarrhea. Negative for vomiting, constipation and blood in stool.  Genitourinary: Negative for dysuria, urgency and frequency.  All other systems reviewed and are negative.     Allergies  Adhesive and Penicillins  Home Medications   Prior to Admission medications   Medication Sig Start Date End Date Taking? Authorizing Provider  aspirin EC 81 MG tablet Take 81 mg by mouth daily.    Yes Historical Provider, MD  carboxymethylcellulose (REFRESH PLUS) 0.5 % SOLN Place 1 drop into both eyes 2 (two) times daily as needed (irritation).   Yes Historical Provider, MD  co-enzyme Q-10 30 MG capsule Take 30 mg by mouth daily.   Yes Historical Provider, MD  HYDROcodone-acetaminophen (NORCO/VICODIN) 5-325 MG tablet Take 1-2 tablets by mouth every 6 (six) hours as needed. Patient taking differently: Take 1-2 tablets by mouth every 6 (six) hours as needed for moderate pain or severe pain.  09/13/15  Yes Roxy Horseman, PA-C  loratadine (CLARITIN) 10 MG tablet Take 10 mg by mouth daily.   Yes Historical Provider, MD  nitroGLYCERIN (NITROSTAT) 0.4 MG SL tablet Place 0.4 mg under the tongue every 5 (five) minutes as needed for chest pain.   Yes Historical Provider, MD  Omega-3 Fatty Acids (FISH OIL) 1000 MG CAPS Take 2,000 mg by mouth daily.   Yes Historical Provider, MD  ranitidine (ZANTAC) 150 MG tablet Take 150 mg by mouth daily.   Yes Historical Provider, MD  sucralfate (CARAFATE) 1 GM/10ML suspension Take 10 mLs (1 g total) by mouth 4 (four) times daily -  with meals and at bedtime. 09/13/15  Yes Roxy Horseman, PA-C  atorvastatin (LIPITOR) 20 MG tablet Take 1 tablet (20 mg total) by mouth daily at 6 PM. Patient not taking: Reported on 09/13/2015 10/08/14   Renae Fickle, MD  carvedilol (COREG) 3.125 MG tablet Take 1 tablet (3.125 mg total) by mouth  2 (two) times daily with a meal. Patient not taking: Reported on 09/13/2015 10/08/14   Renae Fickle, MD  ciprofloxacin (CIPRO) 500 MG tablet Take 1 tablet (500 mg total) by mouth every 12 (twelve) hours. 09/19/15   Mady Gemma, PA-C  HYDROcodone-acetaminophen (NORCO/VICODIN) 5-325 MG tablet Take 1 tablet by mouth every 4 (four) hours as needed. 09/19/15   Mady Gemma, PA-C  metroNIDAZOLE (FLAGYL) 500 MG tablet Take 1 tablet (500 mg total) by mouth 2 (two) times daily. 09/19/15   Mady Gemma, PA-C  ondansetron (ZOFRAN ODT) 4 MG  disintegrating tablet Take 1 tablet (4 mg total) by mouth every 8 (eight) hours as needed for nausea. 09/19/15   Mady Gemma, PA-C  pantoprazole (PROTONIX) 40 MG tablet Take 1 tablet (40 mg total) by mouth 2 (two) times daily before a meal. Patient not taking: Reported on 09/13/2015 10/08/14   Renae Fickle, MD    BP 141/87 mmHg  Pulse 91  Temp(Src) 98.3 F (36.8 C) (Oral)  Resp 22  SpO2 98% Physical Exam  Constitutional: She is oriented to person, place, and time. She appears well-developed and well-nourished. No distress.  HENT:  Head: Normocephalic and atraumatic.  Right Ear: External ear normal.  Left Ear: External ear normal.  Nose: Nose normal.  Mouth/Throat: Uvula is midline, oropharynx is clear and moist and mucous membranes are normal.  Eyes: Conjunctivae, EOM and lids are normal. Pupils are equal, round, and reactive to light. Right eye exhibits no discharge. Left eye exhibits no discharge. No scleral icterus.  Neck: Normal range of motion. Neck supple.  Cardiovascular: Normal rate, regular rhythm, normal heart sounds, intact distal pulses and normal pulses.   Pulmonary/Chest: Effort normal and breath sounds normal. No respiratory distress. She has no wheezes. She has no rales.  Abdominal: Soft. Normal appearance and bowel sounds are normal. She exhibits no distension and no mass. There is tenderness. There is no rigidity, no rebound and no guarding.  Mild diffuse TTP. No rebound, guarding, or masses.  Musculoskeletal: Normal range of motion. She exhibits no edema or tenderness.  Neurological: She is alert and oriented to person, place, and time.  Skin: Skin is warm, dry and intact. No rash noted. She is not diaphoretic. No erythema. No pallor.  Psychiatric: She has a normal mood and affect. Her speech is normal and behavior is normal.  Nursing note and vitals reviewed.   ED Course  Procedures (including critical care time)  Labs Review Labs Reviewed   COMPREHENSIVE METABOLIC PANEL - Abnormal; Notable for the following:    Glucose, Bld 106 (*)    All other components within normal limits  CBC - Abnormal; Notable for the following:    WBC 11.7 (*)    RBC 5.28 (*)    Hemoglobin 16.5 (*)    HCT 48.4 (*)    All other components within normal limits  URINALYSIS, ROUTINE W REFLEX MICROSCOPIC (NOT AT [facility]) - Abnormal; Notable for the following:    Color, Urine AMBER (*)    APPearance CLOUDY (*)    Hgb urine dipstick SMALL (*)    Nitrite POSITIVE (*)    Leukocytes, UA TRACE (*)    All other components within normal limits  URINE MICROSCOPIC-ADD ON - Abnormal; Notable for the following:    Squamous Epithelial / LPF 6-30 (*)    Bacteria, UA MANY (*)    All other components within normal limits  URINE CULTURE  LIPASE, BLOOD  I-STAT TROPOININ, ED  Imaging Review No results found.   I have personally reviewed and evaluated these lab results as part of my medical decision-making.   EKG Interpretation   Date/Time:  Friday September 19 2015 18:23:57 EST Ventricular Rate:  91 PR Interval:  145 QRS Duration: 104 QT Interval:  381 QTC Calculation: 469 R Axis:   77 Text Interpretation:  Sinus rhythm Low voltage, precordial leads Baseline  wander in lead(s) II III aVF No significant change since last tracing  Confirmed by KNOTT MD, Reuel Boom ([id number]) on 09/19/2015 6:51:04 PM      MDM   Final diagnoses:  Abdominal pain, unspecified abdominal location  Diarrhea, unspecified type    60 year old female presents with abdominal pain, nausea, and diarrhea x 10 days. Denies fever, chest pain, shortness of breath, vomiting, hematochezia, melena, dysuria, urgency, frequency, recent travel, recent antibiotic use. Patient evaluated in the ED 2/4 for the same symptoms, at which time she had a negative CT abdomen pelvis and an overall reassuring work-up. She was discharged with carafate and instructed to follow-up with GI.  Patient is afebrile.  Tachycardic to 105. Abdomen soft, non-distended, with mild diffuse TTP, worse in lower quadrants bilaterally. No rebound, guarding, or masses.  EKG sinus rhythmm, HR 91. Troponin negative. CBC remarkable for leukocytosis of 11.7, no anemia. CMP unremarkable. Lipase within normal limits. UA with positive nitrites, trace leukocytes, many bacteria, 0-5 WBC. Urine culture ordered.  Patient given fluids, antiemetics, pain medication.  On reassessment of patient, she reports mild symptom improvement. Will give additional dose of pain medication. Patient discussed with Dr. Clydene Pugh. Will treat with cipro/flagyl given history of diverticulosis and diarrhea as well as possible UTI. No peritoneal signs on abdominal exam, do not feel additional imaging indicated at this time (CT abdomen pelvis unremarkable 2/4). Patient is non-toxic and well-appearing, feel she is stable for discharge at this time. Patient to follow-up with PCP. Return precautions discussed. Patient verbalizes her understanding and is in agreement with plan.  BP 141/87 mmHg  Pulse 91  Temp(Src) 98.3 F (36.8 C) (Oral)  Resp 22  SpO2 98%        Mady Gemma, PA-C 09/19/15 2052  Lyndal Pulley, MD 09/20/15 [phone]

## 2015-09-22 LAB — URINE CULTURE

## 2015-09-24 ENCOUNTER — Telehealth (HOSPITAL_COMMUNITY): Payer: Self-pay

## 2015-09-24 NOTE — Telephone Encounter (Signed)
Post ED Visit - Positive Culture Follow-up  Culture report reviewed by antimicrobial stewardship pharmacist:   Enzo Bi, Pharm.D.  Celedonio Miyamoto, Pharm.D., BCPS  Garvin Fila, Pharm.D.  Georgina Pillion, Pharm.D., BCPS  Annetta North, [place].D., BCPS, AAHIVP  Estella Husk, Pharm.D., BCPS, AAHIVP  Tennis Must, Pharm.D.  Sherle Poe, [place].D.  Positive urine culture, >/= 100,000 colonies -> Klebsiella Pneumoniae Treated with Ciprofloxacin, organism sensitive to the same and no further patient follow-up is required at this time.  Arvid Right 09/24/2015, 9:31 AM

## 2015-09-28 ENCOUNTER — Encounter (HOSPITAL_COMMUNITY): Payer: Self-pay | Admitting: Emergency Medicine

## 2015-09-28 ENCOUNTER — Emergency Department (HOSPITAL_COMMUNITY)
Admission: EM | Admit: 2015-09-28 | Discharge: 2015-09-28 | Disposition: A | Discharge status: Home / Self Care | Payer: MEDICAID | Attending: Emergency Medicine | Admitting: Emergency Medicine

## 2015-09-28 DIAGNOSIS — Z79899 Other long term (current) drug therapy: Secondary | ICD-10-CM | POA: Insufficient documentation

## 2015-09-28 DIAGNOSIS — R1013 Epigastric pain: Secondary | ICD-10-CM | POA: Insufficient documentation

## 2015-09-28 DIAGNOSIS — Z792 Long term (current) use of antibiotics: Secondary | ICD-10-CM | POA: Insufficient documentation

## 2015-09-28 DIAGNOSIS — Z7982 Long term (current) use of aspirin: Secondary | ICD-10-CM | POA: Insufficient documentation

## 2015-09-28 DIAGNOSIS — Z880 Allergy status to penicillin: Secondary | ICD-10-CM | POA: Insufficient documentation

## 2015-09-28 DIAGNOSIS — I252 Old myocardial infarction: Secondary | ICD-10-CM | POA: Insufficient documentation

## 2015-09-28 DIAGNOSIS — Z8719 Personal history of other diseases of the digestive system: Secondary | ICD-10-CM | POA: Insufficient documentation

## 2015-09-28 DIAGNOSIS — R110 Nausea: Secondary | ICD-10-CM | POA: Insufficient documentation

## 2015-09-28 LAB — URINE MICROSCOPIC-ADD ON
BACTERIA UA: NONE SEEN
RBC / HPF: NONE SEEN RBC/hpf (ref 0–5)

## 2015-09-28 LAB — POC OCCULT BLOOD, ED: FECAL OCCULT BLD: NEGATIVE

## 2015-09-28 LAB — COMPREHENSIVE METABOLIC PANEL
ALBUMIN: 4.5 g/dL (ref 3.5–5.0)
ALT: 95 U/L — ABNORMAL HIGH (ref 14–54)
ANION GAP: 9 (ref 5–15)
AST: 58 U/L — ABNORMAL HIGH (ref 15–41)
Alkaline Phosphatase: 80 U/L (ref 38–126)
BUN: 11 mg/dL (ref 6–20)
CALCIUM: 9.4 mg/dL (ref 8.9–10.3)
CO2: 26 mmol/L (ref 22–32)
Chloride: 105 mmol/L (ref 101–111)
Creatinine, Ser: 0.73 mg/dL (ref 0.44–1.00)
GFR calc Af Amer: >60 mL/min (ref >60)
GFR calc non Af Amer: >60 mL/min (ref >60)
Glucose, Bld: 105 mg/dL — ABNORMAL HIGH (ref 65–99)
POTASSIUM: 3.8 mmol/L (ref 3.5–5.1)
SODIUM: 140 mmol/L (ref 135–145)
TOTAL PROTEIN: 7.1 g/dL (ref 6.5–8.1)
Total Bilirubin: 0.4 mg/dL (ref 0.3–1.2)

## 2015-09-28 LAB — CBC
HCT: 44 % (ref 36.0–46.0)
HEMOGLOBIN: 15.1 g/dL — AB (ref 12.0–15.0)
MCH: 31 pg (ref 26.0–34.0)
MCHC: 34.3 g/dL (ref 30.0–36.0)
MCV: 90.3 fL (ref 78.0–100.0)
Platelets: 265 10*3/uL (ref 150–400)
RBC: 4.87 MIL/uL (ref 3.87–5.11)
RDW: 13.2 % (ref 11.5–15.5)
WBC: 7.7 10*3/uL (ref 4.0–10.5)

## 2015-09-28 LAB — URINALYSIS, ROUTINE W REFLEX MICROSCOPIC
Bilirubin Urine: NEGATIVE
Glucose, UA: NEGATIVE mg/dL
Hgb urine dipstick: NEGATIVE
Ketones, ur: NEGATIVE mg/dL
NITRITE: NEGATIVE
Protein, ur: NEGATIVE mg/dL
SPECIFIC GRAVITY, URINE: 1.024 (ref 1.005–1.030)
pH: 5.5 (ref 5.0–8.0)

## 2015-09-28 LAB — LIPASE, BLOOD: Lipase: 36 U/L (ref 11–51)

## 2015-09-28 MED ORDER — GI COCKTAIL ~~LOC~~
30.0000 mL | Freq: Once | ORAL | Status: AC
  Administered 2015-09-28: 30 mL via ORAL
  Filled 2015-09-28: qty 30

## 2015-09-28 MED ORDER — PANTOPRAZOLE SODIUM 20 MG PO TBEC
20.0000 mg | DELAYED_RELEASE_TABLET | Freq: Every day | ORAL | Status: AC
Start: 2015-09-28 — End: ?

## 2015-09-28 MED ORDER — PANTOPRAZOLE SODIUM 40 MG IV SOLR
40.0000 mg | INTRAVENOUS | Status: AC
  Administered 2015-09-28: 40 mg via INTRAVENOUS
  Filled 2015-09-28: qty 40

## 2015-09-28 NOTE — ED Notes (Signed)
Pt is having continued burning lower abdominal pain, today at church noticed a small amount of blood in stool. Also noticed that her urine appeared orange and cloudy today. C/o nausea, relieved by phenergan. Denies vomiting and diarrhea.

## 2015-09-28 NOTE — ED Notes (Signed)
Patient is resting comfortably. Renato Gails and wife at bedside.

## 2015-09-28 NOTE — Discharge Instructions (Signed)
Peptic Ulcer A peptic ulcer is a sore in the lining of your esophagus (esophageal ulcer), stomach (gastric ulcer), or in the first part of your small intestine (duodenal ulcer). The ulcer causes erosion into the deeper tissue. CAUSES  Normally, the lining of the stomach and the small intestine protects itself from the acid that digests food. The protective lining can be damaged by:  An infection caused by a bacterium called Helicobacter pylori (H. pylori).  Regular use of nonsteroidal anti-inflammatory drugs (NSAIDs), such as ibuprofen or aspirin.  Smoking tobacco. Other risk factors include being older than 50, drinking alcohol excessively, and having a family history of ulcer disease.  SYMPTOMS   Burning pain or gnawing in the area between the chest and the belly button.  Heartburn.  Nausea and vomiting.  Bloating. The pain can be worse on an empty stomach and at night. If the ulcer results in bleeding, it can cause:  Black, tarry stools.  Vomiting of bright red blood.  Vomiting of coffee-ground-looking materials. DIAGNOSIS  A diagnosis is usually made based upon your history and an exam. Other tests and procedures may be performed to find the cause of the ulcer. Finding a cause will help determine the best treatment. Tests and procedures may include:  Blood tests, stool tests, or breath tests to check for the bacterium H. pylori.  An upper gastrointestinal (GI) series of the esophagus, stomach, and small intestine.  An endoscopy to examine the esophagus, stomach, and small intestine.  A biopsy. TREATMENT  Treatment may include:  Eliminating the cause of the ulcer, such as smoking, NSAIDs, or alcohol.  Medicines to reduce the amount of acid in your digestive tract.  Antibiotic medicines if the ulcer is caused by the H. pylori bacterium.  An upper endoscopy to treat a bleeding ulcer.  Surgery if the bleeding is severe or if the ulcer created a hole somewhere in the  digestive system. HOME CARE INSTRUCTIONS   Avoid tobacco, alcohol, and caffeine. Smoking can increase the acid in the stomach, and continued smoking will impair the healing of ulcers.  Avoid foods and drinks that seem to cause discomfort or aggravate your ulcer.  Only take medicines as directed by your caregiver. Do not substitute over-the-counter medicines for prescription medicines without talking to your caregiver.  Keep any follow-up appointments and tests as directed. SEEK MEDICAL CARE IF:   Your do not improve within 7 days of starting treatment.  You have ongoing indigestion or heartburn. SEEK IMMEDIATE MEDICAL CARE IF:   You have sudden, sharp, or persistent abdominal pain.  You have bloody or dark black, tarry stools.  You vomit blood or vomit that looks like coffee grounds.  You become light-headed, weak, or feel faint.  You become sweaty or clammy. MAKE SURE YOU:   Understand these instructions.  Will watch your condition.  Will get help right away if you are not doing well or get worse.   This information is not intended to replace advice given to you by your health care provider. Make sure you discuss any questions you have with your health care provider.   Document Released: 07/23/2000 Document Revised: 08/16/2014 Document Reviewed: 02/23/2012 Elsevier Interactive Patient Education 2016 [employer]. Food Choices for Peptic Ulcer Disease When you have peptic ulcer disease, the foods you eat and your eating habits are very important. Choosing the right foods can help ease the discomfort of peptic ulcer disease. WHAT GENERAL GUIDELINES DO I NEED TO FOLLOW?  Choose fruits, vegetables, whole  grains, and low-fat meat, fish, and poultry.   Keep a food diary to identify foods that cause symptoms.  Avoid foods that cause irritation or pain. These may be different for different people.  Eat frequent small meals instead of three large meals each day. The pain may  be worse when your stomach is empty.  Avoid eating close to bedtime. WHAT FOODS ARE NOT RECOMMENDED? The following are some foods and drinks that may worsen your symptoms:  Black, white, and red pepper.  Hot sauce.  Chili peppers.  Chili powder.  Chocolate and cocoa.   Alcohol.  Tea, coffee, and cola (regular and decaffeinated). The items listed above may not be a complete list of foods and beverages to avoid. Contact your dietitian for more information.   This information is not intended to replace advice given to you by your health care provider. Make sure you discuss any questions you have with your health care provider.   Document Released: 10/18/2011 Document Revised: 07/31/2013 Document Reviewed: 05/30/2013 Elsevier Interactive Patient Education 2016 [employer] Gastroesophageal Reflux Disease, Adult Normally, food travels down the esophagus and stays in the stomach to be digested. However, when a person has gastroesophageal reflux disease (GERD), food and stomach acid move back up into the esophagus. When this happens, the esophagus becomes sore and inflamed. Over time, GERD can create small holes (ulcers) in the lining of the esophagus.  CAUSES This condition is caused by a problem with the muscle between the esophagus and the stomach (lower esophageal sphincter, or LES). Normally, the LES muscle closes after food passes through the esophagus to the stomach. When the LES is weakened or abnormal, it does not close properly, and that allows food and stomach acid to go back up into the esophagus. The LES can be weakened by certain dietary substances, medicines, and medical conditions, including:  Tobacco use.  Pregnancy.  Having a hiatal hernia.  Heavy alcohol use.  Certain foods and beverages, such as coffee, chocolate, onions, and peppermint. RISK FACTORS This condition is more likely to develop in:  People who have an increased body weight.  People who have  connective tissue disorders.  People who use NSAID medicines. SYMPTOMS Symptoms of this condition include:  Heartburn.  Difficult or painful swallowing.  The feeling of having a lump in the throat.  Abitter taste in the mouth.  Bad breath.  Having a large amount of saliva.  Having an upset or bloated stomach.  Belching.  Chest pain.  Shortness of breath or wheezing.  Ongoing (chronic) cough or a night-time cough.  Wearing away of tooth enamel.  Weight loss. Different conditions can cause chest pain. Make sure to see your health care provider if you experience chest pain. DIAGNOSIS Your health care provider will take a medical history and perform a physical exam. To determine if you have mild or severe GERD, your health care provider may also monitor how you respond to treatment. You may also have other tests, including:  An endoscopy toexamine your stomach and esophagus with a small camera.  A test thatmeasures the acidity level in your esophagus.  A test thatmeasures how much pressure is on your esophagus.  A barium swallow or modified barium swallow to show the shape, size, and functioning of your esophagus. TREATMENT The goal of treatment is to help relieve your symptoms and to prevent complications. Treatment for this condition may vary depending on how severe your symptoms are. Your health care provider may recommend:  Changes to  your diet.  Medicine.  Surgery. HOME CARE INSTRUCTIONS Diet  Follow a diet as recommended by your health care provider. This may involve avoiding foods and drinks such as:  Coffee and tea (with or without caffeine).  Drinks that containalcohol.  Energy drinks and sports drinks.  Carbonated drinks or sodas.  Chocolate and cocoa.  Peppermint and mint flavorings.  Garlic and onions.  Horseradish.  Spicy and acidic foods, including peppers, chili powder, curry powder, vinegar, hot sauces, and barbecue  sauce.  Citrus fruit juices and citrus fruits, such as oranges, lemons, and limes.  Tomato-based foods, such as red sauce, chili, salsa, and pizza with red sauce.  Fried and fatty foods, such as donuts, french fries, potato chips, and high-fat dressings.  High-fat meats, such as hot dogs and fatty cuts of red and white meats, such as rib eye steak, sausage, ham, and bacon.  High-fat dairy items, such as whole milk, butter, and cream cheese.  Eat small, frequent meals instead of large meals.  Avoid drinking large amounts of liquid with your meals.  Avoid eating meals during the 2-3 hours before bedtime.  Avoid lying down right after you eat.  Do not exercise right after you eat. General Instructions  Pay attention to any changes in your symptoms.  Take over-the-counter and prescription medicines only as told by your health care provider. Do not take aspirin, ibuprofen, or other NSAIDs unless your health care provider told you to do so.  Do not use any tobacco products, including cigarettes, chewing tobacco, and e-cigarettes. If you need help quitting, ask your health care provider.  Wear loose-fitting clothing. Do not wear anything tight around your waist that causes pressure on your abdomen.  Raise (elevate) the head of your bed 6 inches (15cm).  Try to reduce your stress, such as with yoga or meditation. If you need help reducing stress, ask your health care provider.  If you are overweight, reduce your weight to an amount that is healthy for you. Ask your health care provider for guidance about a safe weight loss goal.  Keep all follow-up visits as told by your health care provider. This is important. SEEK MEDICAL CARE IF:  You have new symptoms.  You have unexplained weight loss.  You have difficulty swallowing, or it hurts to swallow.  You have wheezing or a persistent cough.  Your symptoms do not improve with treatment.  You have a hoarse voice. SEEK  IMMEDIATE MEDICAL CARE IF:  You have pain in your arms, neck, jaw, teeth, or back.  You feel sweaty, dizzy, or light-headed.  You have chest pain or shortness of breath.  You vomit and your vomit looks like blood or coffee grounds.  You faint.  Your stool is bloody or black.  You cannot swallow, drink, or eat.   This information is not intended to replace advice given to you by your health care provider. Make sure you discuss any questions you have with your health care provider.   Document Released: 05/05/2005 Document Revised: 04/16/2015 Document Reviewed: 11/20/2014 Elsevier Interactive Patient Education [employer]

## 2015-09-28 NOTE — ED Provider Notes (Signed)
CSN: [id number]     Arrival date & time 09/28/15  1344 History   First MD Initiated Contact with Patient 09/28/15 1831     Chief Complaint  Patient presents with  . Abdominal Pain  . Rectal Bleeding     (Consider location/radiation/quality/duration/timing/severity/associated sxs/prior Treatment) HPI Comments: Patient presents to the emergency department with chief complaint of epigastric abdominal pain. She states that she has bad acid reflux. She states that she has been having persistent symptoms for the past month or so. She states that she became more concerned today when she thought that she noticed some blood in her stool as well as orange urine. She complains of some nausea, which is relieved by taking Phenergan, but denies any vomiting or diarrhea. She denies any fevers or chills. Patient has been unable to follow-up with the gastroenterologist because she does not have insurance. There are no other associated symptoms.  The history is provided by the patient. No language interpreter was used.    Past Medical History  Diagnosis Date  . MI (myocardial infarction) (HCC)   . Diverticulitis    Past Surgical History  Procedure Laterality Date  . Knee arthroplasty Bilateral   . Cholecystectomy    . Appendectomy    . Abdominal hysterectomy     Family History  Problem Relation Age of Onset  . Cancer Mother   . Rheum arthritis Father    Social History  Substance Use Topics  . Smoking status: Never Smoker   . Smokeless tobacco: Never Used  . Alcohol Use: No   OB History    No data available     Review of Systems  Constitutional: Negative for fever and chills.  Respiratory: Negative for shortness of breath.   Cardiovascular: Negative for chest pain.  Gastrointestinal: Positive for nausea and abdominal pain. Negative for vomiting, diarrhea and constipation.  Genitourinary: Negative for dysuria.  All other systems reviewed and are negative.     Allergies  Adhesive  and Penicillins  Home Medications   Prior to Admission medications   Medication Sig Start Date End Date Taking? Authorizing Provider  aspirin EC 81 MG tablet Take 81 mg by mouth daily.   Yes Historical Provider, MD  carboxymethylcellulose (REFRESH PLUS) 0.5 % SOLN Place 1 drop into both eyes 2 (two) times daily as needed (irritation).   Yes Historical Provider, MD  ciprofloxacin (CIPRO) 500 MG tablet Take 1 tablet (500 mg total) by mouth every 12 (twelve) hours. 09/19/15  Yes Mady Gemma, PA-C  co-enzyme Q-10 30 MG capsule Take 30 mg by mouth daily.   Yes Historical Provider, MD  HYDROcodone-acetaminophen (NORCO/VICODIN) 5-325 MG tablet Take 1-2 tablets by mouth every 6 (six) hours as needed. Patient taking differently: Take 1-2 tablets by mouth every 6 (six) hours as needed for moderate pain or severe pain.  09/13/15  Yes Roxy Horseman, PA-C  loratadine (CLARITIN) 10 MG tablet Take 10 mg by mouth daily.   Yes Historical Provider, MD  metroNIDAZOLE (FLAGYL) 500 MG tablet Take 1 tablet (500 mg total) by mouth 2 (two) times daily. 09/19/15  Yes Mady Gemma, PA-C  nitroGLYCERIN (NITROSTAT) 0.4 MG SL tablet Place 0.4 mg under the tongue every 5 (five) minutes as needed for chest pain.   Yes Historical Provider, MD  Omega-3 Fatty Acids (FISH OIL) 1000 MG CAPS Take 2,000 mg by mouth daily.   Yes Historical Provider, MD  ondansetron (ZOFRAN ODT) 4 MG disintegrating tablet Take 1 tablet (4 mg total) by  mouth every 8 (eight) hours as needed for nausea. 09/19/15  Yes Mady Gemma, PA-C  ranitidine (ZANTAC) 300 MG tablet Take 600 mg by mouth daily.   Yes Historical Provider, MD  sucralfate (CARAFATE) 1 GM/10ML suspension Take 10 mLs (1 g total) by mouth 4 (four) times daily -  with meals and at bedtime. 09/13/15  Yes Roxy Horseman, PA-C  HYDROcodone-acetaminophen (NORCO/VICODIN) 5-325 MG tablet Take 1 tablet by mouth every 4 (four) hours as needed. 09/19/15   Dorise Hiss Westfall, PA-C    BP 135/75 mmHg  Pulse 72  Temp(Src) 98.1 F (36.7 C) (Oral)  Resp 16  SpO2 100% Physical Exam  Constitutional: She is oriented to person, place, and time. She appears well-developed and well-nourished.  HENT:  Head: Normocephalic and atraumatic.  Eyes: Conjunctivae and EOM are normal. Pupils are equal, round, and reactive to light.  Neck: Normal range of motion. Neck supple.  Cardiovascular: Normal rate and regular rhythm.  Exam reveals no gallop and no friction rub.   No murmur heard. Pulmonary/Chest: Effort normal and breath sounds normal. No respiratory distress. She has no wheezes. She has no rales. She exhibits no tenderness.  Abdominal: Soft. Bowel sounds are normal. She exhibits no distension and no mass. There is no tenderness. There is no rebound and no guarding.  No focal abdominal tenderness, no RLQ tenderness or pain at McBurney's point, no RUQ tenderness or Murphy's sign, no left-sided abdominal tenderness, no fluid wave, or signs of peritonitis   Genitourinary:  Chaperone present for rectal exam Normal DRE No hemorrhoids, fissures, or hemorrhage Soft brown stool  Musculoskeletal: Normal range of motion. She exhibits no edema or tenderness.  Neurological: She is alert and oriented to person, place, and time.  Skin: Skin is warm and dry.  Psychiatric: She has a normal mood and affect. Her behavior is normal. Judgment and thought content normal.  Nursing note and vitals reviewed.   ED Course  Procedures (including critical care time) Results for orders placed or performed during the hospital encounter of 09/28/15  Lipase, blood  Result Value Ref Range   Lipase 36 11 - 51 U/L  Comprehensive metabolic panel  Result Value Ref Range   Sodium 140 135 - 145 mmol/L   Potassium 3.8 3.5 - 5.1 mmol/L   Chloride 105 101 - 111 mmol/L   CO2 26 22 - 32 mmol/L   Glucose, Bld 105 (H) 65 - 99 mg/dL   BUN 11 6 - 20 mg/dL   Creatinine, Ser 1.61 0.44 - 1.00 mg/dL   Calcium 9.4  8.9 - 09.6 mg/dL   Total Protein 7.1 6.5 - 8.1 g/dL   Albumin 4.5 3.5 - 5.0 g/dL   AST 58 (H) 15 - 41 U/L   ALT 95 (H) 14 - 54 U/L   Alkaline Phosphatase 80 38 - 126 U/L   Total Bilirubin 0.4 0.3 - 1.2 mg/dL   GFR calc non Af Amer >60 >60 mL/min   GFR calc Af Amer >60 >60 mL/min   Anion gap 9 5 - 15  CBC  Result Value Ref Range   WBC 7.7 4.0 - 10.5 K/uL   RBC 4.87 3.87 - 5.11 MIL/uL   Hemoglobin 15.1 (H) 12.0 - 15.0 g/dL   HCT 04.5 40.9 - 81.1 %   MCV 90.3 78.0 - 100.0 fL   MCH 31.0 26.0 - 34.0 pg   MCHC 34.3 30.0 - 36.0 g/dL   RDW 91.4 78.2 - 95.6 %   Platelets  265 150 - 400 K/uL  Urinalysis, Routine w reflex microscopic (not at [facility])  Result Value Ref Range   Color, Urine YELLOW YELLOW   APPearance CLEAR CLEAR   Specific Gravity, Urine 1.024 1.005 - 1.030   pH 5.5 5.0 - 8.0   Glucose, UA NEGATIVE NEGATIVE mg/dL   Hgb urine dipstick NEGATIVE NEGATIVE   Bilirubin Urine NEGATIVE NEGATIVE   Ketones, ur NEGATIVE NEGATIVE mg/dL   Protein, ur NEGATIVE NEGATIVE mg/dL   Nitrite NEGATIVE NEGATIVE   Leukocytes, UA TRACE (A) NEGATIVE  Urine microscopic-add on  Result Value Ref Range   Squamous Epithelial / LPF 0-5 (A) NONE SEEN   WBC, UA 0-5 0 - 5 WBC/hpf   RBC / HPF NONE SEEN 0 - 5 RBC/hpf   Bacteria, UA NONE SEEN NONE SEEN  POC occult blood, ED  Result Value Ref Range   Fecal Occult Bld NEGATIVE NEGATIVE   Dg Chest 2 View  09/13/2015  CLINICAL DATA:  Chest pain EXAM: CHEST  2 VIEW COMPARISON:  10/06/2014 FINDINGS: The heart size and mediastinal contours are within normal limits. Both lungs are clear. The visualized skeletal structures are unremarkable. IMPRESSION: No active cardiopulmonary disease. Electronically Signed   By: Marlan Palau M.D.   On: 09/13/2015 18:40   Ct Abdomen Pelvis W Contrast  09/13/2015  CLINICAL DATA:  Abdominal pain EXAM: CT ABDOMEN AND PELVIS WITH CONTRAST TECHNIQUE: Multidetector CT imaging of the abdomen and pelvis was performed using the standard  protocol following bolus administration of intravenous contrast. CONTRAST:  25mL OMNIPAQUE IOHEXOL 300 MG/ML SOLN, OMNIPAQUE IOHEXOL 300 MG/ML SOLN COMPARISON:  None. FINDINGS: Lower chest:  Lung bases are clear without infiltrate or effusion Hepatobiliary: Liver homogeneous and density. Normal size and contour. Prior cholecystectomy. Bile ducts nondilated. Pancreas: Negative Spleen: Negative Adrenals/Urinary Tract: Normal kidneys. No renal mass or obstruction or stone. Urinary bladder normal and empty. Stomach/Bowel: Negative for bowel obstruction. Appendix not visualized and possibly surgically absent. No evidence of acute appendicitis. Sigmoid diverticulosis without diverticulitis. Negative for bowel edema or mass. Vascular/Lymphatic: Minimal atherosclerotic calcification in the abdominal aorta without aneurysm. No lymphadenopathy. Reproductive: Hysterectomy changes.  Negative for pelvic mass. Other: No free fluid. Musculoskeletal: No skeletal lesion. IMPRESSION: No acute abnormality. Cholecystectomy changes. Appendix not visualized and may be surgically absent. Diverticulosis without diverticulitis. Electronically Signed   By: Marlan Palau M.D.   On: 09/13/2015 20:47    I have personally reviewed and evaluated these images and lab results as part of my medical decision-making.   MDM   Final diagnoses:  Epigastric pain    Patient with epigastric abdominal pain. She's been having these symptoms for the past month or so. She has been unable to follow-up with a gastroenterologist because she does not have insurance. Her laboratory workup is reassuring. Her abdominal exam is also reassuring. She does not have focal tenderness. Doubt acute abdomen. I will switch the patient from Zantac to Protonix. I have also given the patient resources to get in touch with social work, who can hopefully help the patient obtain follow-up with gastroenterology.    Roxy Horseman, PA-C 09/28/15 2040  Dione Booze, MD 09/28/15 [phone]

## 2015-09-29 IMAGING — CT CT ANGIO CHEST
2 of 9 series · 18 of 46 positions shown · IV contrast (Omni 300)
Comparison: Chest radiograph of October 06, 2014.

CLINICAL DATA: Acute chest pain.

EXAM:
CT ANGIOGRAPHY CHEST WITH CONTRAST
TECHNIQUE: Multidetector CT imaging of the chest was performed using the
standard protocol during bolus administration of intravenous
contrast. Multiplanar CT image reconstructions and MIPs were
obtained to evaluate the vascular anatomy.
CONTRAST:  80mL OMNIPAQUE IOHEXOL 350 MG/ML SOLN

[Series 5: thins · axial · 0.64mm/px · z∈[-230,-10]mm · 15 of 248 slices shown]
[im 14/248  lung]
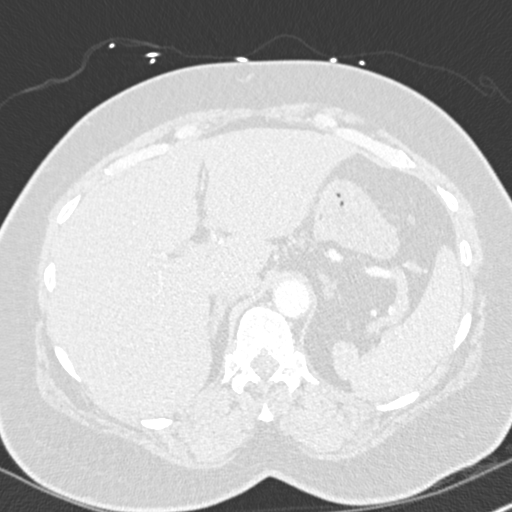
[im 28/248  soft-tissue]
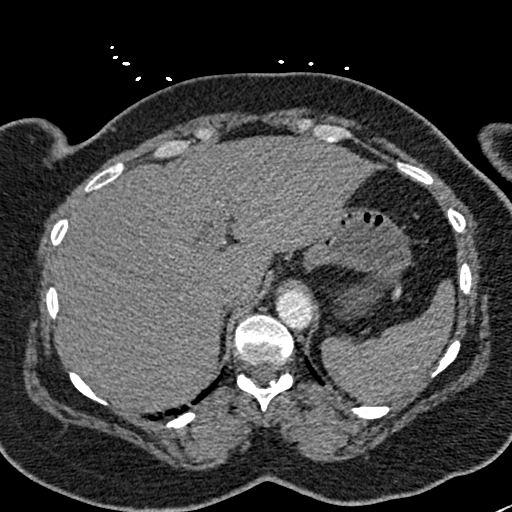
[im 42/248  lung]
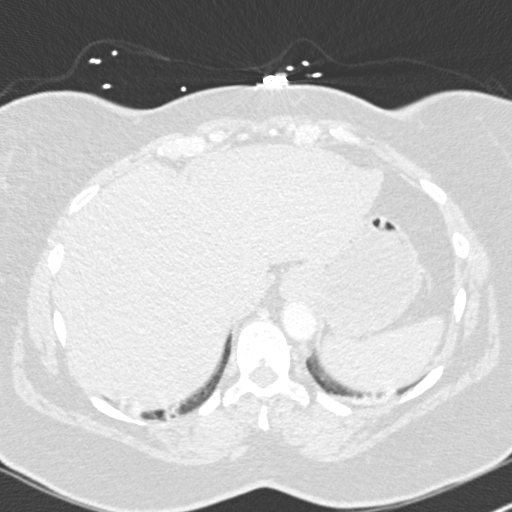
[im 55/248  soft-tissue]
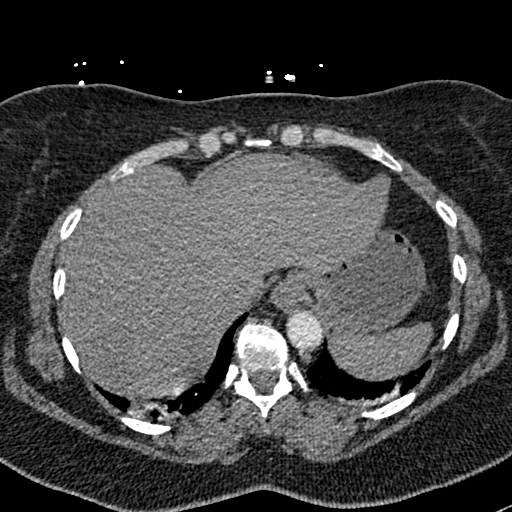
[im 83/248  lung]
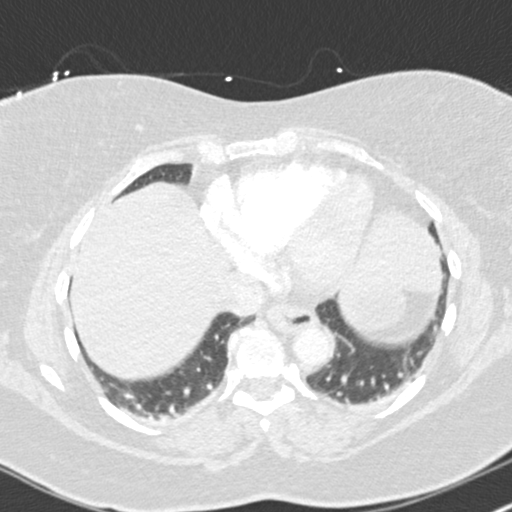
[im 97/248  soft-tissue]
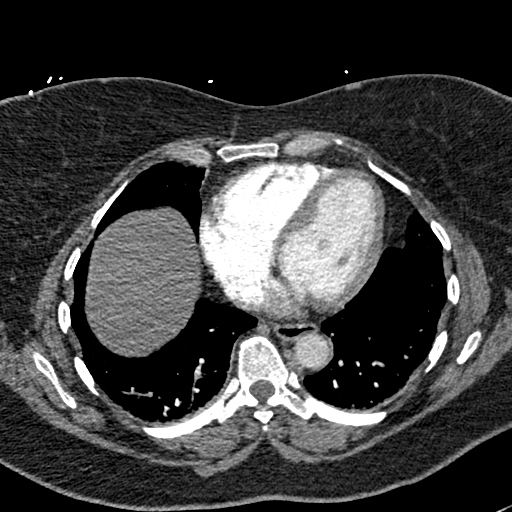
[im 110/248  lung]
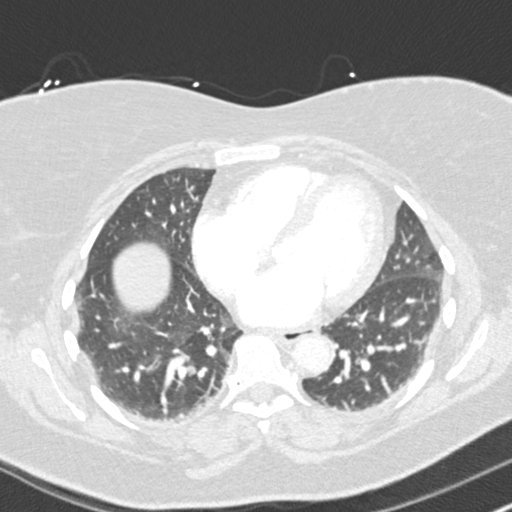
[im 124/248  soft-tissue]
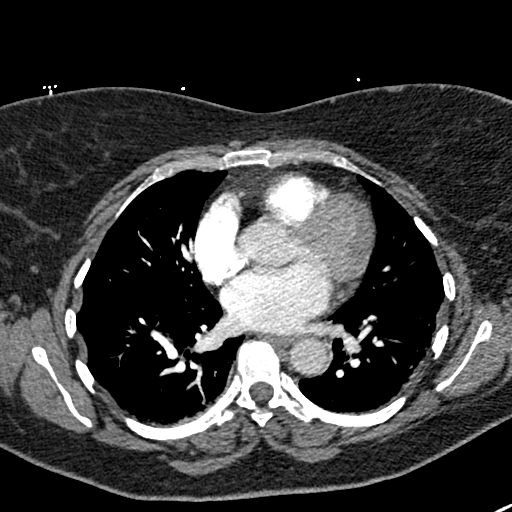
[im 138/248  lung]
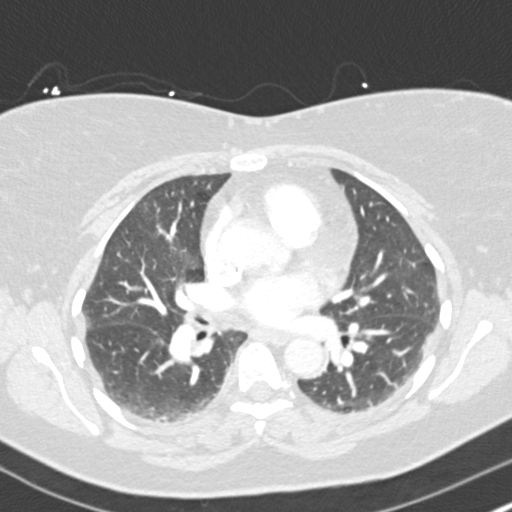
[im 151/248  soft-tissue]
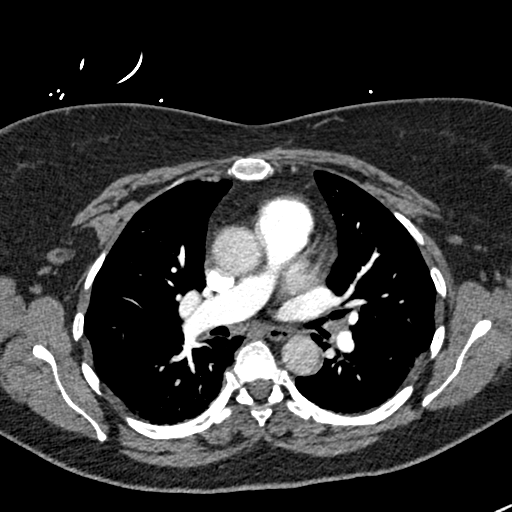
[im 165/248  lung]
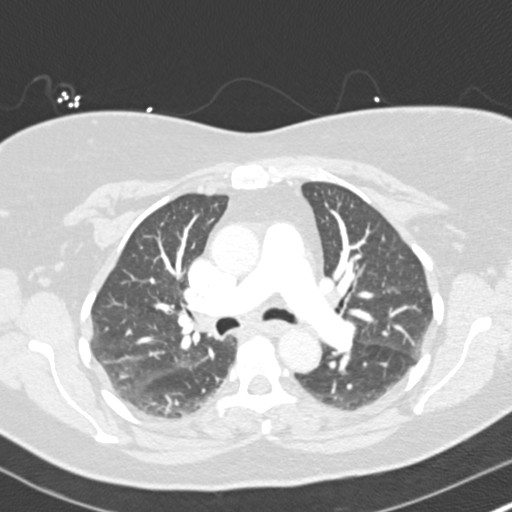
[im 193/248  soft-tissue]
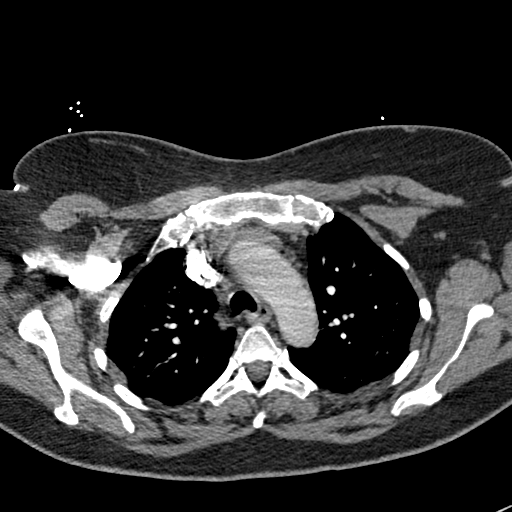
[im 206/248  lung]
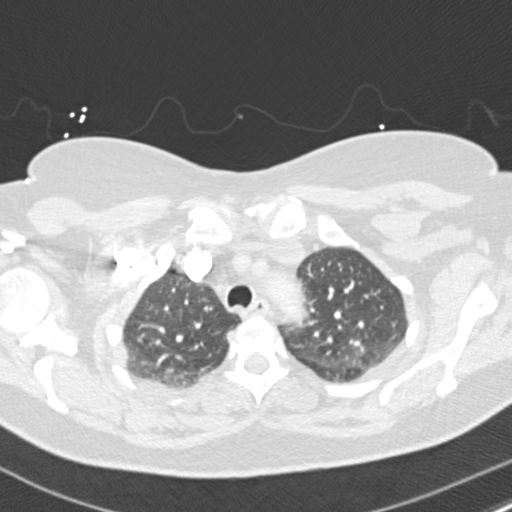
[im 220/248  soft-tissue]
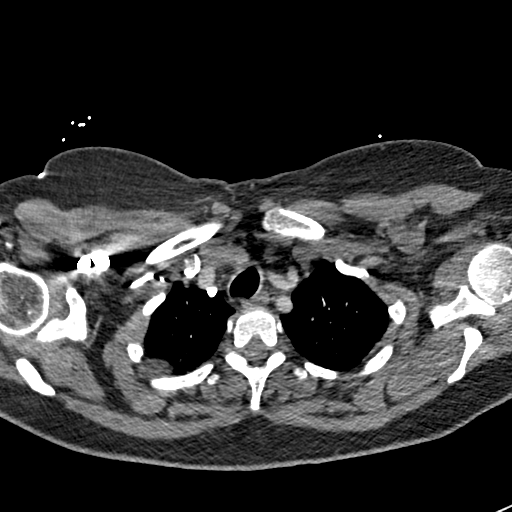
[im 234/248  lung]
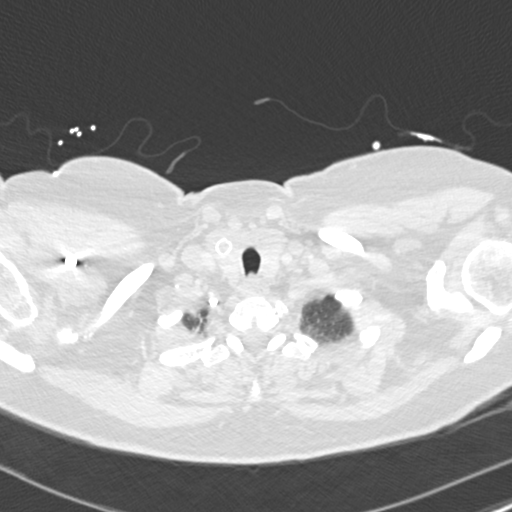

[Series 7: coronal mpr · coronal · 0.53mm/px · 3 of 119 slices shown]
[im 30/119  soft-tissue]
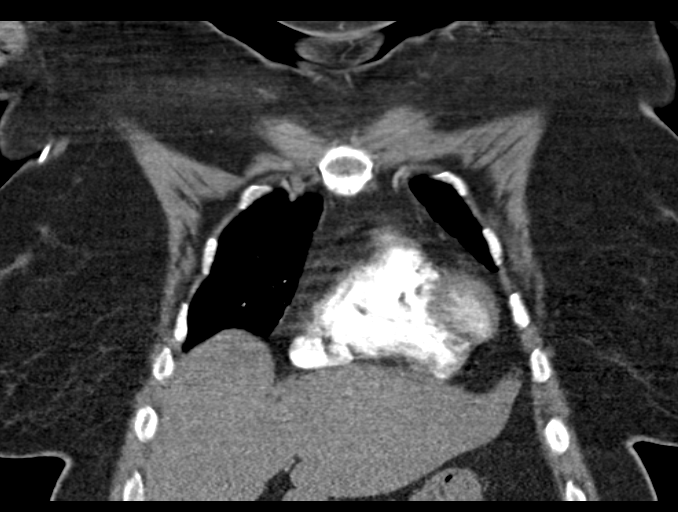
[im 60/119  soft-tissue]
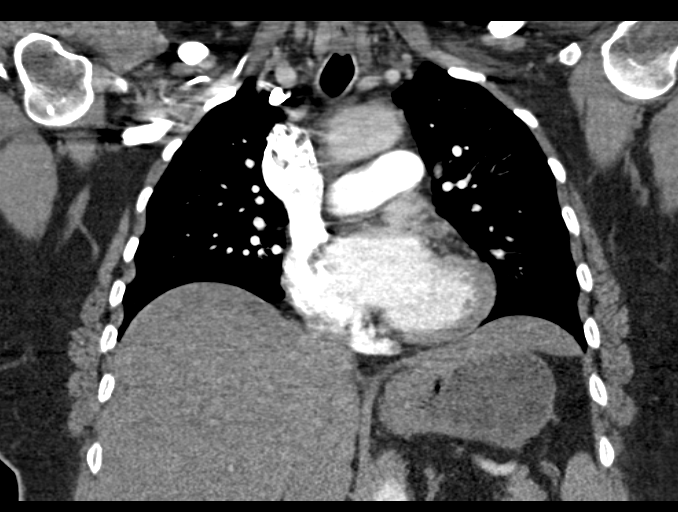
[im 89/119  soft-tissue]
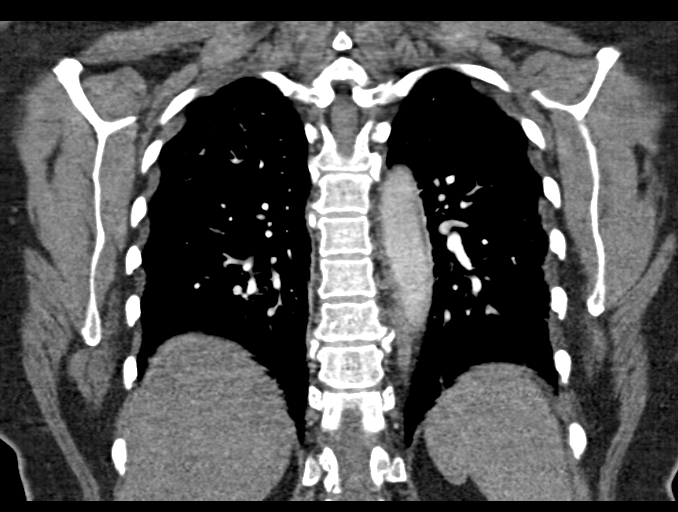

[18 of 46 positions shown; findings below may reference images not displayed]

FINDINGS: No pneumothorax or pleural effusion is noted. Minimal subsegmental
atelectasis is noted posteriorly in the right lung base. There is no
evidence of pulmonary embolus. There is no evidence of thoracic
aortic dissection or aneurysm. Visualized portion of upper abdomen
is unremarkable. No significant mediastinal mass or adenopathy is
noted. No significant osseous abnormality is noted in the chest.

Review of the MIP images confirms the above findings.
IMPRESSION: No evidence of pulmonary embolus. No significant acute abnormality
seen in the chest.

## 2016-09-04 IMAGING — CR DG CHEST 2V
2 series · 2 of 2 positions shown · non-contrast
Comparison: 10/06/2014

CLINICAL DATA: Chest pain

EXAM:
CHEST  2 VIEW

[w chest pa]
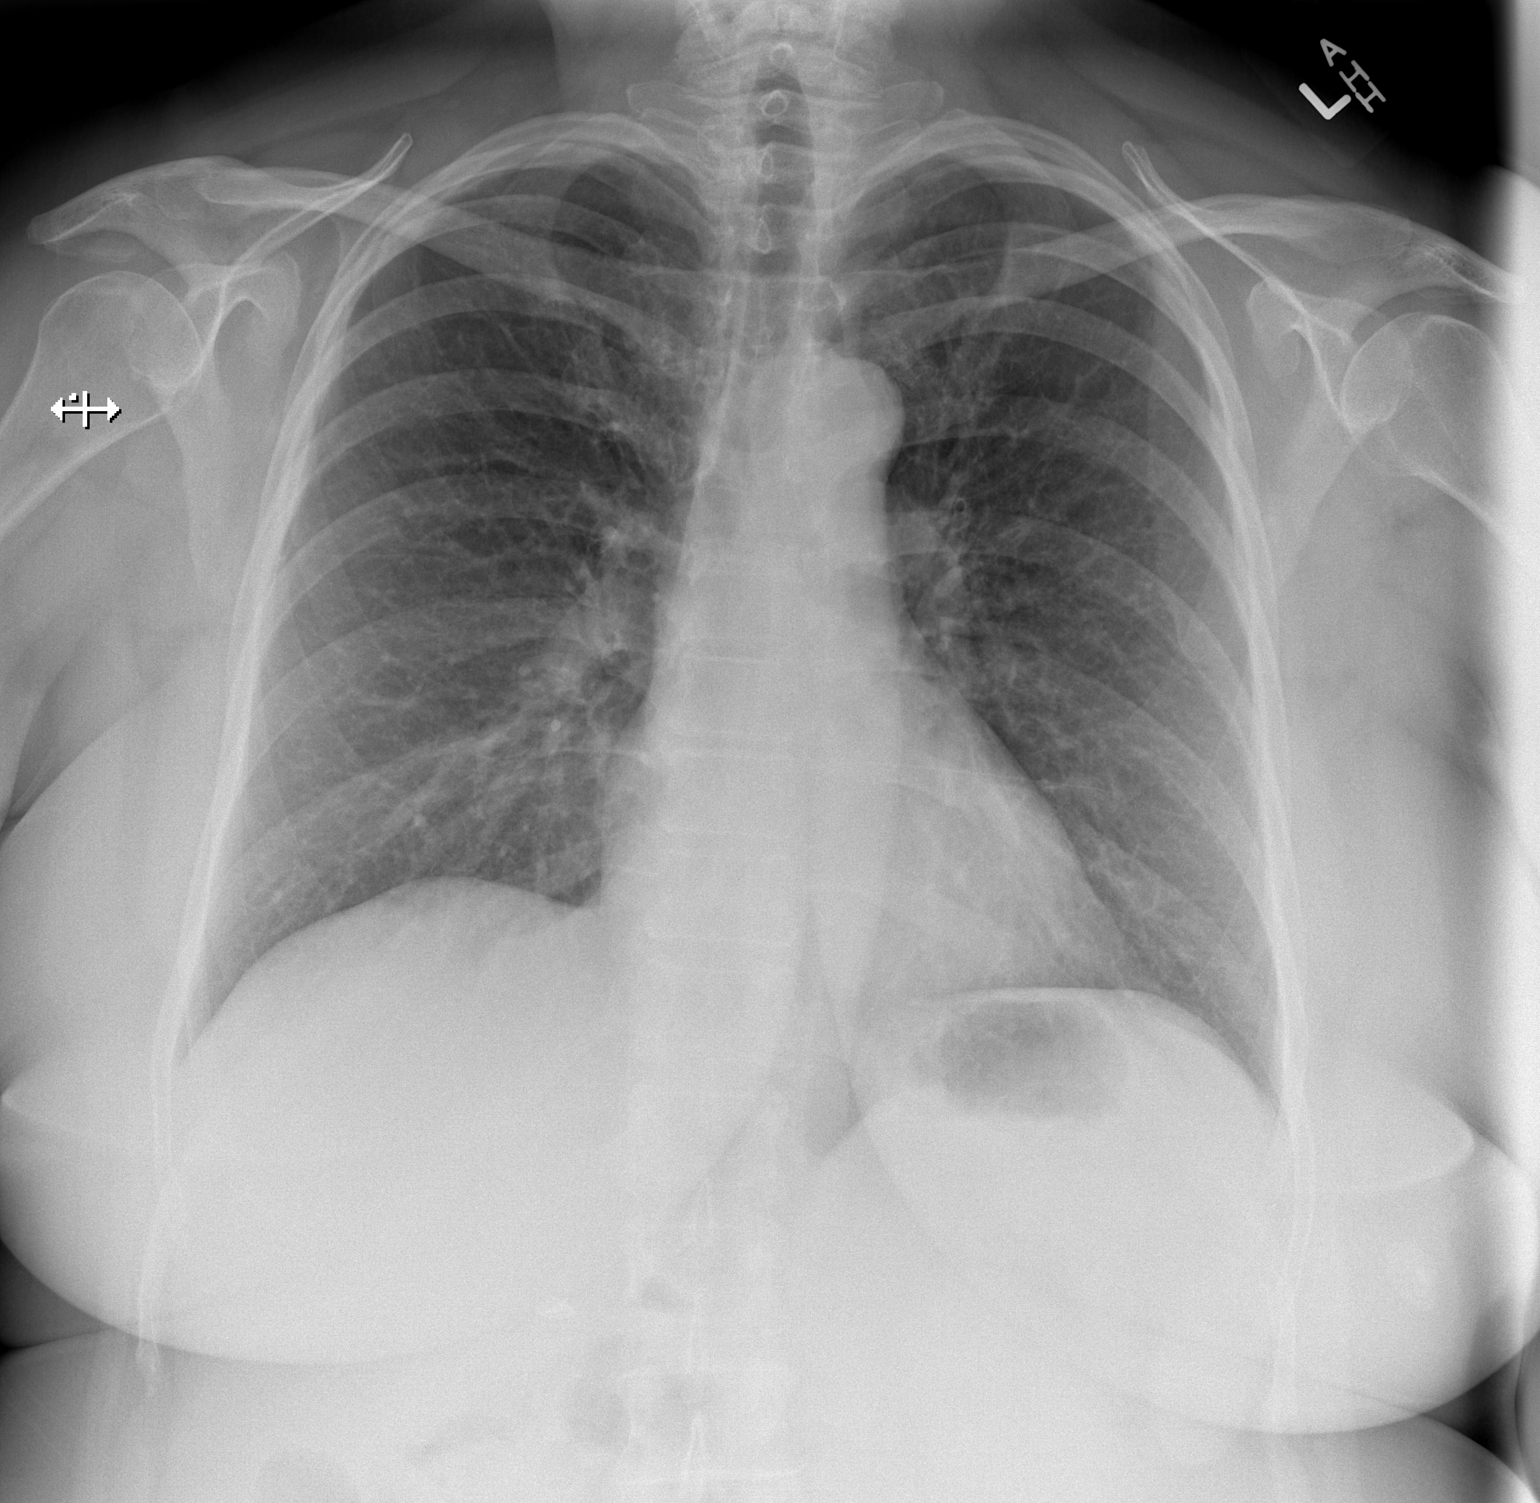

[w chest lat]
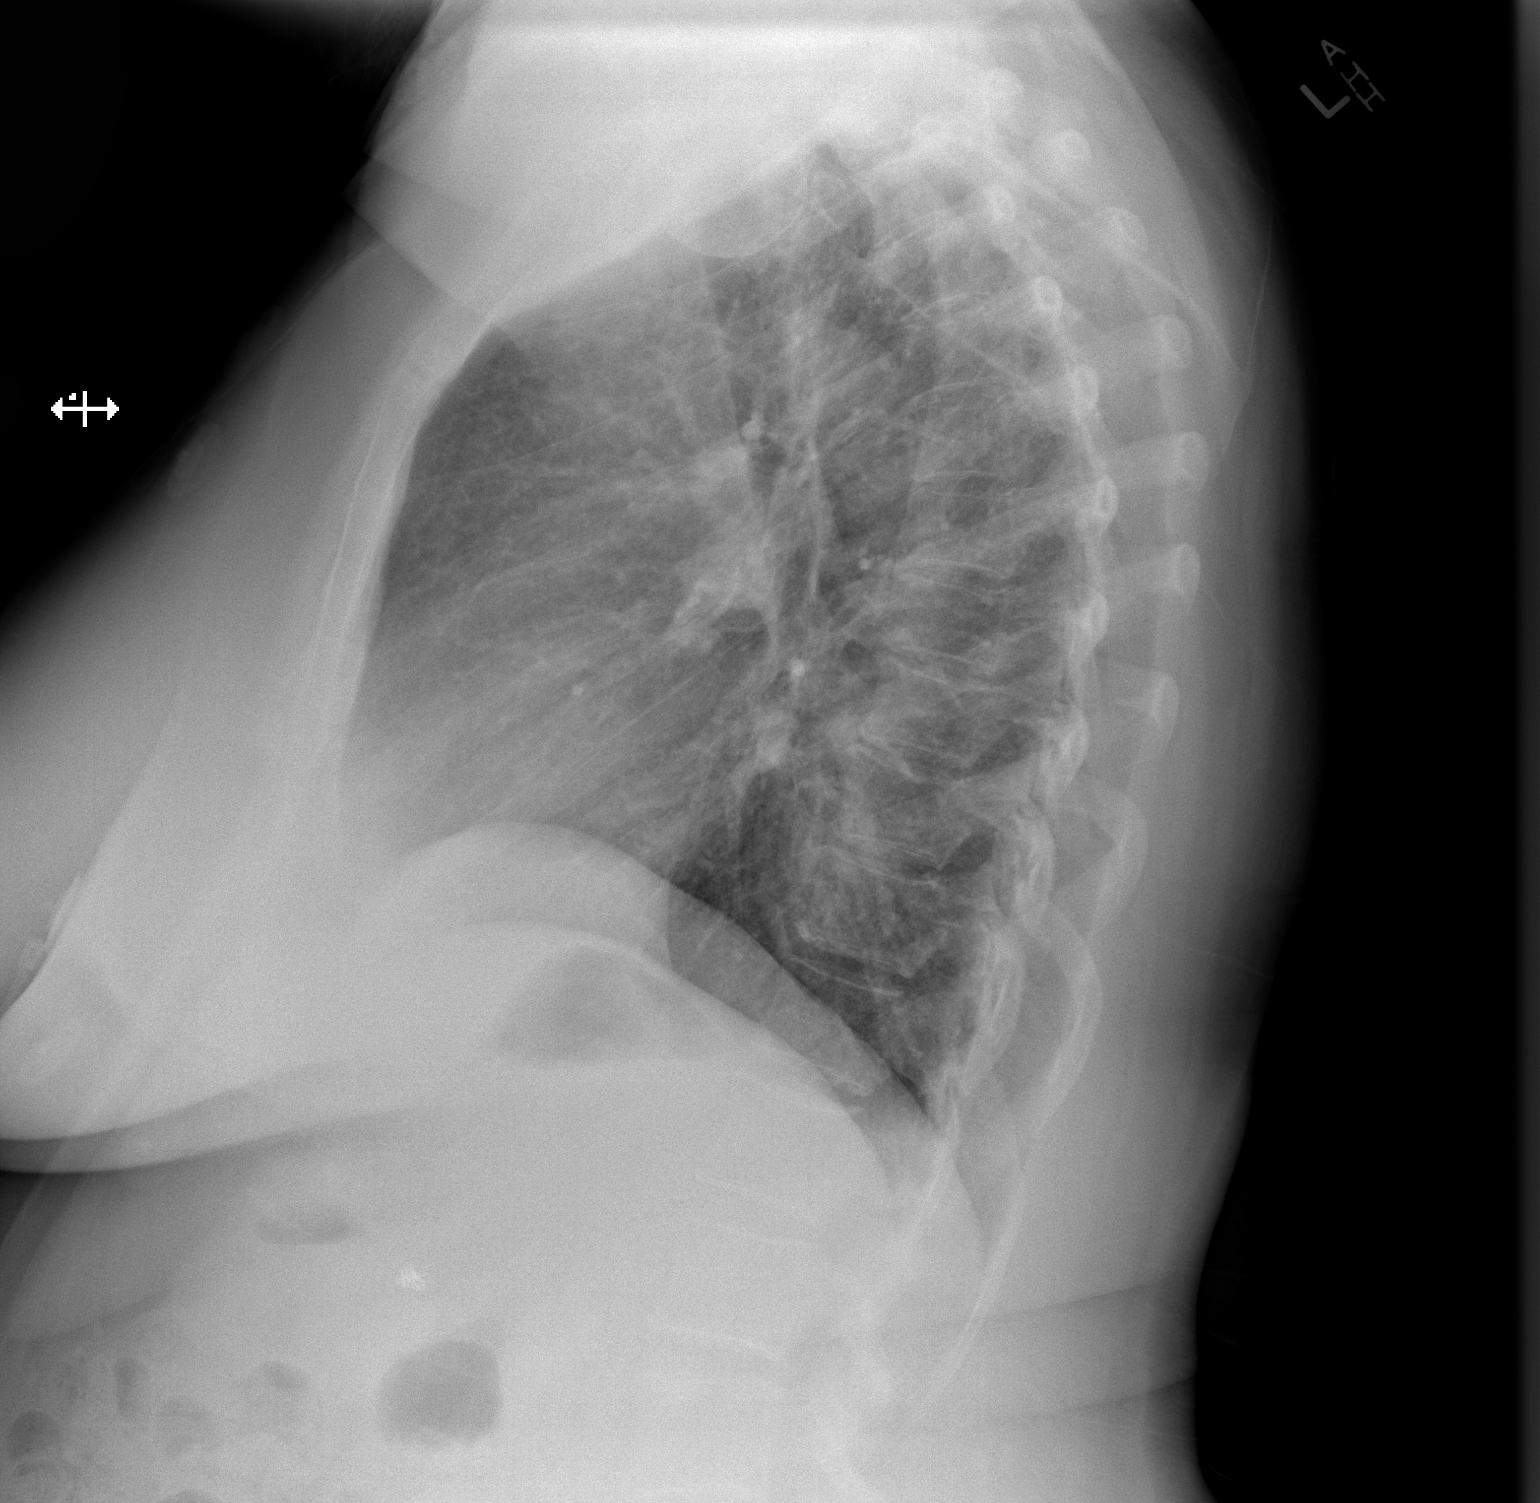

[2 of 2 positions shown; findings below may reference images not displayed]

FINDINGS: The heart size and mediastinal contours are within normal limits.
Both lungs are clear. The visualized skeletal structures are
unremarkable.
IMPRESSION: No active cardiopulmonary disease.

## 2019-10-26 ENCOUNTER — Ambulatory Visit: Payer: MEDICAID | Attending: Internal Medicine

## 2019-10-26 DIAGNOSIS — Z23 Encounter for immunization: Secondary | ICD-10-CM

## 2019-10-26 NOTE — Progress Notes (Signed)
   Covid-19 Vaccination Clinic  Name:  Theresa Montgomery    MRN: [id number] DOB: Oct 08, 1955  10/26/2019  [name] was observed post Covid-19 immunization for 15 minutes without incident. She was provided with Vaccine Information Sheet and instruction to access the V-Safe system.   [name] was instructed to call 911 with any severe reactions post vaccine: Marland Kitchen Difficulty breathing  . Swelling of face and throat  . A fast heartbeat  . A bad rash all over body  . Dizziness and weakness   Immunizations Administered    Name Date Dose VIS Date Route   Pfizer COVID-19 Vaccine 10/26/2019 12:31 PM 0.3 mL 07/20/2019 Intramuscular   Manufacturer: [employer], [employer]   Lot: [id number]   NDC: [address]-1

## 2019-11-21 ENCOUNTER — Ambulatory Visit: Payer: MEDICAID | Attending: Internal Medicine

## 2019-11-21 DIAGNOSIS — Z23 Encounter for immunization: Secondary | ICD-10-CM

## 2019-11-21 NOTE — Progress Notes (Signed)
   Covid-19 Vaccination Clinic  Name:  LARON ANGELINI    MRN: [id number] DOB: June 22, 1956  11/21/2019  Ms. Hooper was observed post Covid-19 immunization for 15 minutes without incident. She was provided with Vaccine Information Sheet and instruction to access the V-Safe system.   Ms. Cabanilla was instructed to call 911 with any severe reactions post vaccine: Marland Kitchen Difficulty breathing  . Swelling of face and throat  . A fast heartbeat  . A bad rash all over body  . Dizziness and weakness   Immunizations Administered    Name Date Dose VIS Date Route   Pfizer COVID-19 Vaccine 11/21/2019 11:49 AM 0.3 mL 07/20/2019 Intramuscular   Manufacturer: [employer], [employer]   Lot: [id number]   NDC: [address]-5
